# Patient Record
Sex: Female | Born: 1975 | Race: Black or African American | Hispanic: No | Marital: Married | Smoking: Never smoker
Health system: Southern US, Community
[De-identification: ages and names within clinical notes are randomized; demographics above are authoritative.]

## PROBLEM LIST (undated history)

## (undated) HISTORY — PX: TUBAL LIGATION: SHX77

---

## 2010-02-03 ENCOUNTER — Emergency Department: Payer: Self-pay | Admitting: Emergency Medicine

## 2011-12-25 IMAGING — CR DG CHEST 2V
1 series · 2 of 2 positions shown · non-contrast
Comparison: none

REASON FOR EXAM: cough, fever, chest pain
COMMENTS:

PROCEDURE:     DXR - DXR CHEST PA (OR AP) AND LATERAL  - February 03, 2010 [DATE]
RESULT:     The lungs are mildly hypoinflated. There is dense consolidation
in the right upper lobe medially. The heart is not enlarged and the
pulmonary vascularity is not engorged.

[Series 1: view not recorded · 0.17mm/px · 2 of 2 slices shown]
[im 1/2]
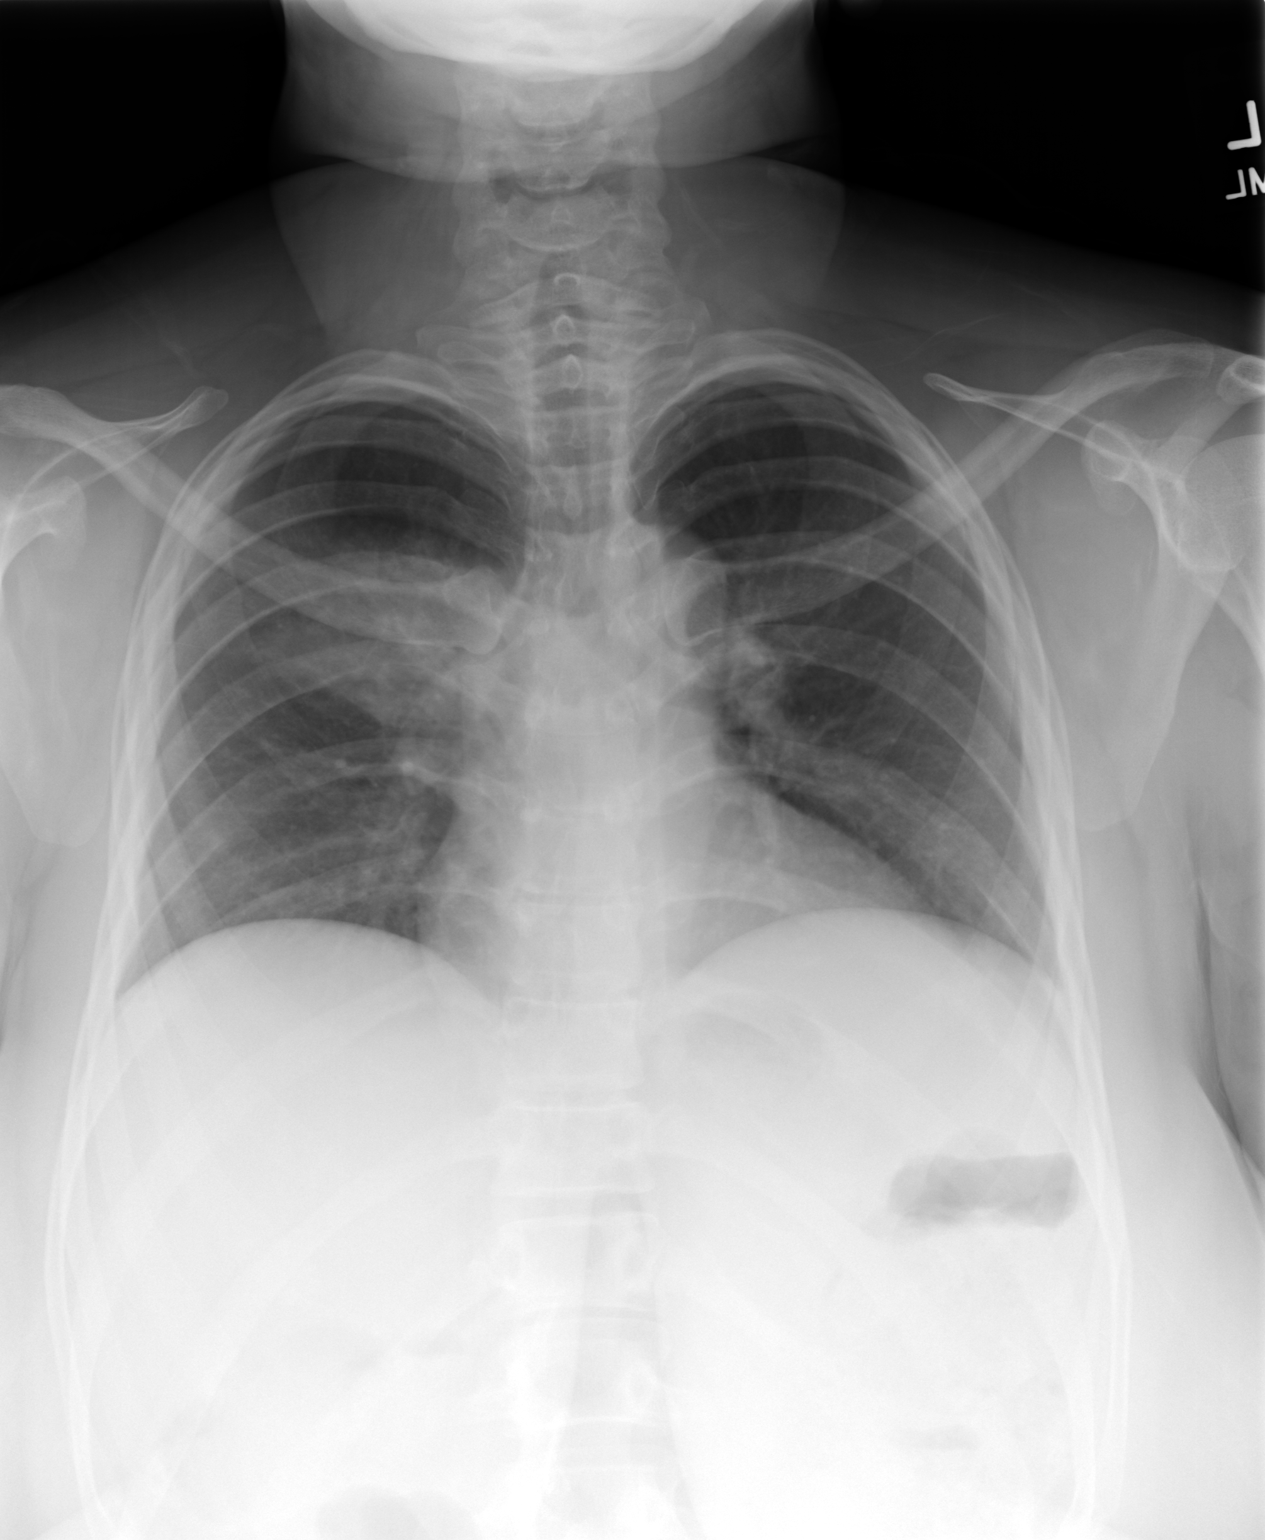
[im 2/2]
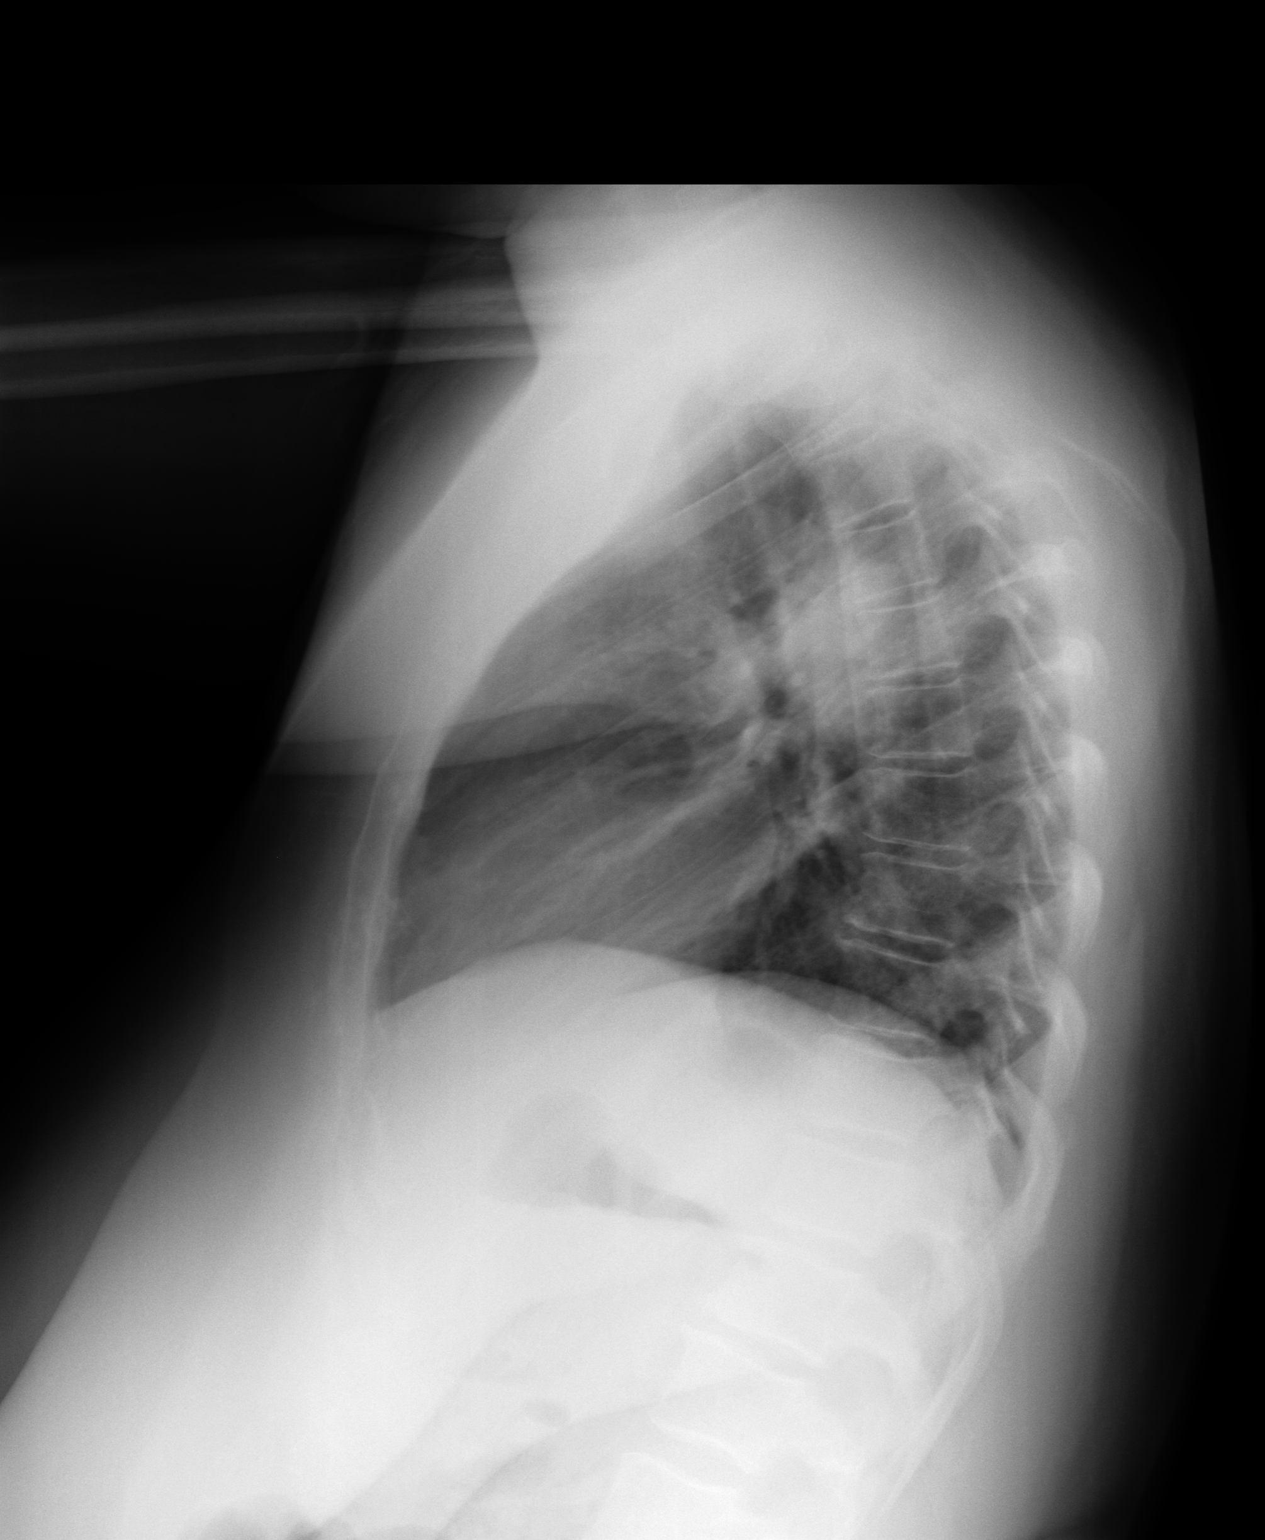

[2 of 2 positions shown; findings below may reference images not displayed]

IMPRESSION: The findings are consistent with right upper lobe pneumonia.

## 2013-10-04 ENCOUNTER — Emergency Department: Payer: Self-pay | Admitting: Emergency Medicine

## 2013-10-07 LAB — BETA STREP CULTURE(ARMC)

## 2014-09-15 ENCOUNTER — Ambulatory Visit: Payer: Self-pay | Admitting: Internal Medicine

## 2017-01-07 ENCOUNTER — Ambulatory Visit: Payer: Self-pay | Admitting: Nurse Practitioner

## 2017-04-10 ENCOUNTER — Other Ambulatory Visit: Payer: Self-pay

## 2017-04-10 ENCOUNTER — Ambulatory Visit: Payer: BC Managed Care – PPO | Admitting: Nurse Practitioner

## 2017-04-10 ENCOUNTER — Encounter: Payer: Self-pay | Admitting: Nurse Practitioner

## 2017-04-10 VITALS — BP 131/87 | HR 73 | Temp 98.6°F | Ht 67.75 in | Wt 216.0 lb

## 2017-04-10 DIAGNOSIS — R51 Headache: Secondary | ICD-10-CM | POA: Diagnosis not present

## 2017-04-10 DIAGNOSIS — Z7689 Persons encountering health services in other specified circumstances: Secondary | ICD-10-CM | POA: Diagnosis not present

## 2017-04-10 DIAGNOSIS — T3995XA Adverse effect of unspecified nonopioid analgesic, antipyretic and antirheumatic, initial encounter: Principal | ICD-10-CM

## 2017-04-10 DIAGNOSIS — R519 Headache, unspecified: Secondary | ICD-10-CM

## 2017-04-10 DIAGNOSIS — G4440 Drug-induced headache, not elsewhere classified, not intractable: Secondary | ICD-10-CM

## 2017-04-10 NOTE — Patient Instructions (Addendum)
Michelle Branch, Thank you for coming in to clinic today.  1. Significantly reduce your use of BC powder.  Excedrine migraine or ibuprofen are good options for future headaches.   - Only take when you have an active headache. - Can take 600-800 mg ibuprofen once every 8 hours up to max of 3 per day.   2. You can reduce your headaches overall if you stop taking medication and put up with the headache for several days and break the cycle of "rebound headaches" from use of NSAIDS.  Please schedule a follow-up appointment with Wilhelmina McardleLauren Jayceion Lisenby, AGNP. Return in about 2 months (around 06/08/2017) for annual physical.  If you have any other questions or concerns, please feel free to call the clinic or send a message through MyChart. You may also schedule an earlier appointment if necessary.  You will receive a survey after today's visit either digitally by e-mail or paper by [employer] mail. Your experiences and feedback matter to us.  Please respond so we know how we are doing as we provide care for you.   Wilhelmina McardleLauren Karlynn Furrow, DNP, AGNP-BC Adult Gerontology Nurse Practitioner [facility], [facility]

## 2017-04-10 NOTE — Progress Notes (Signed)
Subjective:    Patient ID: Michelle Branch, female    DOB: 12-31-1975, 42 y.o.   MRN: [id number]  Michelle Branch is a 42 y.o. female presenting on 04/10/2017 for Establish Care (transition care from Dr. Esmond HarpsKim Jones )   HPI Establish Care New Provider Pt last seen by PCP Dr. Esmond HarpsKim Jones in early 2017.  Obtain records.   URI symptoms: Patient has had recent URI symptoms within the last week.  Patient does report her cold is improving.  Breast cancer screening Patient is now 42 years old and desires to discuss mammogram screening she has never had one in the past and is not sure when she should start.  Headaches Patient reports regular headaches.  She has migraines frequently.  She also uses BC powders several times a day.  She takes up to 6 doses a day and takes it sometimes even before onset of headache to prevent headaches.  She is been encouraged by family members that she takes too much BC powder. -Occasionally Excedrin Migraine or ibuprofen will help, but she notes "BC powders always work best." - Patient has had no signs or symptoms of melena or other GI bleed.  Denies abdominal pain.   History reviewed. No pertinent past medical history. Past Surgical History:  Procedure Laterality Date  . TUBAL LIGATION     Social History   Socioeconomic History  . Marital status: Married    Spouse name: Not on file  . Number of children: 2  . Years of education: associates  . Highest education level: Associate degree: occupational, Scientist, product/process developmenttechnical, or vocational program  Occupational History  . Not on file  Social Needs  . Financial resource strain: Not on file  . Food insecurity:    Worry: Not on file    Inability: Not on file  . Transportation needs:    Medical: Not on file    Non-medical: Not on file  Tobacco Use  . Smoking status: Never Smoker  . Smokeless tobacco: Never Used  Substance and Sexual Activity  . Alcohol use: Yes    Comment: ocassionally  . Drug use: No  . Sexual  activity: Yes    Birth control/protection: Surgical  Lifestyle  . Physical activity:    Days per week: Not on file    Minutes per session: Not on file  . Stress: Not on file  Relationships  . Social connections:    Talks on phone: Not on file    Gets together: Not on file    Attends religious service: Not on file    Active member of club or organization: Not on file    Attends meetings of clubs or organizations: Not on file    Relationship status: Not on file  . Intimate partner violence:    Fear of current or ex partner: No    Emotionally abused: No    Physically abused: No    Forced sexual activity: No  Other Topics Concern  . Not on file  Social History Narrative  . Not on file   Family History  Problem Relation Age of Onset  . Diabetes Paternal Grandmother    Current Outpatient Medications on File Prior to Visit  Medication Sig  . Multiple Vitamins-Calcium (ONE-A-DAY WOMENS FORMULA PO) Take by mouth.   No current facility-administered medications on file prior to visit.     Review of Systems  Constitutional: Negative.   HENT: Negative.   Eyes: Negative.   Respiratory: Negative.   Cardiovascular: Negative.  Gastrointestinal: Negative.   Endocrine: Negative.   Genitourinary: Negative.   Musculoskeletal: Negative.   Skin: Negative.   Allergic/Immunologic: Negative.   Neurological: Positive for headaches.  Hematological: Negative.   Psychiatric/Behavioral: Negative.    Per HPI unless specifically indicated above     Objective:    BP 131/87 (BP Location: Right Arm, Patient Position: Sitting, Cuff Size: Normal)   Pulse 73   Temp 98.6 F (37 C) (Oral)   Ht 5' 7.75" (1.721 m)   Wt 216 lb (98 kg)   LMP 04/02/2017 (Approximate)   BMI 33.09 kg/m   Wt Readings from Last 3 Encounters:  04/10/17 216 lb (98 kg)    Physical Exam  General - obese, well-appearing, NAD HEENT - Normocephalic, atraumatic, PERRL, EOMI, patent nares w/o congestion, oropharynx  clear, MMM Neck - supple, non-tender, no LAD, no thyromegaly, no carotid bruit Heart - RRR, no murmurs heard Lungs - Clear throughout all lobes, no wheezing, crackles, or rhonchi. Normal work of breathing. Abdomen - soft, NTND, no masses, no hepatosplenomegaly, active bowel sounds Extremeties - non-tender, no edema, cap refill < 2 seconds, peripheral pulses intact +2 bilaterally Skin - warm, dry, no rashes Neuro - awake, alert, oriented x3, CN II-X intact, intact muscle strength 5/5 bilaterally, intact distal sensation to light touch, normal coordination, normal gait Psych - Normal mood and affect, normal behavior     No results found for this or any previous visit.    Assessment & Plan:   Problem List Items Addressed This Visit    None    Visit Diagnoses    Encounter to establish care    -  Primary Patient with recent PCP visits with Dr. Esmond Harps.  Desires to transfer care.  Records reviewed in care everywhere.  Patient history reviewed in clinic today.  Will followup in 2 month approx for annual physical and will readdress mammogram screening.  With average risk, pt has elected currently to defer mammography.    Recurrent headache       Analgesic rebound headache      Chronic tension type headache with rebound headaches secondary to excessive use of aspirin for abortive therapy.  Use of up to 6 BC powder per day is reported by patient.  No signs or symptoms of alarm.  No active neurological deficits noted.  Plan: 1.  Encouraged patient to stop using BC powder completely.  May use Excedrin Migraine or ibuprofen 600-800 mg once daily for abortive therapy. 2. Disease education about tension-type headache provided.  Pt verablizes understanding. 3. Followup 2 months.       Follow up plan: Return in about 2 months (around 06/08/2017) for annual physical.  Wilhelmina Mcardle, DNP, AGPCNP-BC Adult Gerontology Primary Care Nurse Practitioner [facility] 05/12/2017, 3:06 PM

## 2017-06-05 ENCOUNTER — Encounter: Payer: Self-pay | Admitting: Nurse Practitioner

## 2017-06-05 ENCOUNTER — Other Ambulatory Visit: Payer: Self-pay

## 2017-06-05 ENCOUNTER — Ambulatory Visit (INDEPENDENT_AMBULATORY_CARE_PROVIDER_SITE_OTHER): Payer: BC Managed Care – PPO | Admitting: Nurse Practitioner

## 2017-06-05 VITALS — BP 139/83 | HR 82 | Temp 98.6°F | Ht 67.75 in | Wt 208.6 lb

## 2017-06-05 DIAGNOSIS — Z0000 Encounter for general adult medical examination without abnormal findings: Secondary | ICD-10-CM | POA: Diagnosis not present

## 2017-06-05 DIAGNOSIS — Z124 Encounter for screening for malignant neoplasm of cervix: Secondary | ICD-10-CM | POA: Diagnosis not present

## 2017-06-05 NOTE — Progress Notes (Signed)
Subjective:    Patient ID: Michelle Branch, female    DOB: 11-Feb-1976, 42 y.o.   MRN: [id number]  Michelle Branch is a 42 y.o. female presenting on 06/05/2017 for Annual Exam   HPI Annual Physical Exam Patient has been feeling well with reduced headaches after stopping BC powder.  They have no acute concerns today. Sleeps 7 hours per night uninterrupted.    HEALTH MAINTENANCE: Weight/BMI: reduced by 8 lbs Physical activity: exercise at gym 30-45 minutes twice per week. Diet: poor with meals out 5-6 x weekly, long periods of daytime fasting (breakfast and late dinner 10 pm) Seatbelt: always Sunscreen: never PAP: due Mammogram: declines to later date HIV: negative in past and no new sexual encounters Optometry: yearly Dentistry: yearly  VACCINES: Tetanus: has had to have for childcare Influenza: not received 2018-2019  No past medical history on file. Past Surgical History:  Procedure Laterality Date  . TUBAL LIGATION     Social History   Socioeconomic History  . Marital status: Married    Spouse name: Not on file  . Number of children: 2  . Years of education: associates  . Highest education level: Associate degree: occupational, Scientist, product/process development, or vocational program  Occupational History  . Not on file  Social Needs  . Financial resource strain: Not on file  . Food insecurity:    Worry: Not on file    Inability: Not on file  . Transportation needs:    Medical: Not on file    Non-medical: Not on file  Tobacco Use  . Smoking status: Never Smoker  . Smokeless tobacco: Never Used  Substance and Sexual Activity  . Alcohol use: Yes    Comment: ocassionally  . Drug use: No  . Sexual activity: Yes    Birth control/protection: Surgical  Lifestyle  . Physical activity:    Days per week: Not on file    Minutes per session: Not on file  . Stress: Not on file  Relationships  . Social connections:    Talks on phone: Not on file    Gets together: Not on file   Attends religious service: Not on file    Active member of club or organization: Not on file    Attends meetings of clubs or organizations: Not on file    Relationship status: Not on file  . Intimate partner violence:    Fear of current or ex partner: No    Emotionally abused: No    Physically abused: No    Forced sexual activity: No  Other Topics Concern  . Not on file  Social History Narrative  . Not on file   Family History  Problem Relation Age of Onset  . Diabetes Paternal Grandmother    Current Outpatient Medications on File Prior to Visit  Medication Sig  . Multiple Vitamins-Calcium (ONE-A-DAY WOMENS FORMULA PO) Take by mouth.   No current facility-administered medications on file prior to visit.     Review of Systems Per HPI unless specifically indicated above     Objective:    BP 139/83 (BP Location: Right Arm, Patient Position: Sitting, Cuff Size: Normal)   Pulse 82   Temp 98.6 F (37 C) (Oral)   Ht 5' 7.75" (1.721 m)   Wt 208 lb 9.6 oz (94.6 kg)   LMP 05/13/2017 (Approximate)   BMI 31.95 kg/m   Wt Readings from Last 3 Encounters:  06/05/17 208 lb 9.6 oz (94.6 kg)  04/10/17 216 lb (98 kg)  Physical Exam  Constitutional: She is oriented to person, place, and time. She appears well-developed and well-nourished. No distress.  HENT:  Head: Normocephalic and atraumatic.  Right Ear: External ear normal.  Left Ear: External ear normal.  Nose: Nose normal.  Mouth/Throat: Oropharynx is clear and moist.  Eyes: Pupils are equal, round, and reactive to light. Conjunctivae and EOM are normal.  Neck: Normal range of motion. Neck supple. No JVD present. No tracheal deviation present. No thyromegaly present.  Cardiovascular: Normal rate, regular rhythm, normal heart sounds and intact distal pulses. Exam reveals no gallop and no friction rub.  No murmur heard. Pulmonary/Chest: Effort normal and breath sounds normal. No respiratory distress. Right breast exhibits no  inverted nipple, no mass, no nipple discharge, no skin change and no tenderness. Left breast exhibits no inverted nipple, no mass, no nipple discharge, no skin change and no tenderness. Breasts are symmetrical.  Abdominal: Soft. Bowel sounds are normal. She exhibits no distension and no mass. There is no tenderness. Hernia confirmed negative in the right inguinal area and confirmed negative in the left inguinal area.  Genitourinary: Vagina normal and uterus normal. Pelvic exam was performed with patient supine. No labial fusion. There is no rash, tenderness, lesion or injury on the right labia. There is no rash, tenderness, lesion or injury on the left labia. Cervix exhibits no motion tenderness, no discharge and no friability. Right adnexum displays no mass, no tenderness and no fullness. Left adnexum displays no mass, no tenderness and no fullness. No vaginal discharge found.    Genitourinary Comments: Rough texture to cervix upon palpation. Multiple small white lesions noted between 12-3 o'clock  Musculoskeletal: Normal range of motion.  Lymphadenopathy:    She has no cervical adenopathy. No inguinal adenopathy noted on the right or left side.  Neurological: She is alert and oriented to person, place, and time. No cranial nerve deficit.  Skin: Skin is warm and dry. Capillary refill takes less than 2 seconds.  Psychiatric: She has a normal mood and affect. Her behavior is normal. Judgment and thought content normal.  Nursing note and vitals reviewed.     Assessment & Plan:   Problem List Items Addressed This Visit    None    Visit Diagnoses    Encounter for annual physical exam    -  Primary   Relevant Orders   TSH   VITAMIN D 25 Hydroxy (Vit-D Deficiency, Fractures)   Lipid panel   Hemoglobin A1c   CBC with Differential/Platelet   COMPLETE METABOLIC PANEL WITH GFR   Pap IG and HPV (high risk) DNA detection   Cervical cancer screening       Relevant Orders   Pap IG and HPV (high  risk) DNA detection      Physical exam with no new findings.  Well adult with no acute concerns.  Plan: 1. Obtain health maintenance screenings. - Discussed timing for mammogram screening.  Pt is at average risk for breast cancer with no family history of breast cancer noted. - Discussed PAP smear intervals.  Pt will prefer to have extended 5 year interval if pap is normal and HPV negative.  PAP performed today for cervical ca screening. - Labs collected today for routine screenings.  2. Return 1 year for annual physical.  Follow up plan: Return in about 1 year (around 06/06/2018) for annual physical.  Wilhelmina McardleLauren Letrice Pollok, DNP, AGPCNP-BC Adult Gerontology Primary Care Nurse Practitioner [facility] 06/05/2017, 9:10 AM

## 2017-06-05 NOTE — Patient Instructions (Addendum)
Rosario JacksJackie Trautmann,   Thank you for coming in to clinic today.  1. Increase your physical activity until you are increasing your heart rate for 30 minutes on most days of the week.  2. Eat regular, healthy meals or snacks.  Avoid eating late at night.  3. Continue using ibuprofen only as needed.  Please schedule a follow-up appointment with Wilhelmina McardleLauren Pinki Rottman, AGNP. Return in about 1 year (around 06/06/2018) for annual physical.  If you have any other questions or concerns, please feel free to call the clinic or send a message through MyChart. You may also schedule an earlier appointment if necessary.  You will receive a survey after today's visit either digitally by e-mail or paper by [employer] mail. Your experiences and feedback matter to us.  Please respond so we know how we are doing as we provide care for you.   Wilhelmina McardleLauren Elisa Sorlie, DNP, AGNP-BC Adult Gerontology Nurse Practitioner [facility], [facility]

## 2017-06-06 LAB — CBC WITH DIFFERENTIAL/PLATELET
Basophils Absolute: 32 cells/uL (ref 0–200)
Basophils Relative: 0.7 %
Eosinophils Absolute: 419 cells/uL (ref 15–500)
Eosinophils Relative: 9.1 %
HCT: 35.9 % (ref 35.0–45.0)
Hemoglobin: 11.7 g/dL (ref 11.7–15.5)
Lymphs Abs: 1789 cells/uL (ref 850–3900)
MCH: 26.4 pg — ABNORMAL LOW (ref 27.0–33.0)
MCHC: 32.6 g/dL (ref 32.0–36.0)
MCV: 80.9 fL (ref 80.0–100.0)
MPV: 10.2 fL (ref 7.5–12.5)
Monocytes Relative: 11.3 %
Neutro Abs: 1840 cells/uL (ref 1500–7800)
Neutrophils Relative %: 40 %
Platelets: 253 10*3/uL (ref 140–400)
RBC: 4.44 10*6/uL (ref 3.80–5.10)
RDW: 12 % (ref 11.0–15.0)
Total Lymphocyte: 38.9 %
WBC mixed population: 520 cells/uL (ref 200–950)
WBC: 4.6 10*3/uL (ref 3.8–10.8)

## 2017-06-06 LAB — COMPLETE METABOLIC PANEL WITH GFR
AG Ratio: 1.4 (calc) (ref 1.0–2.5)
ALT: 10 U/L (ref 6–29)
AST: 11 U/L (ref 10–30)
Albumin: 4 g/dL (ref 3.6–5.1)
Alkaline phosphatase (APISO): 139 U/L — ABNORMAL HIGH (ref 33–115)
BUN/Creatinine Ratio: 23 (calc) — ABNORMAL HIGH (ref 6–22)
BUN: 11 mg/dL (ref 7–25)
CO2: 28 mmol/L (ref 20–32)
Calcium: 9.5 mg/dL (ref 8.6–10.2)
Chloride: 103 mmol/L (ref 98–110)
Creat: 0.47 mg/dL — ABNORMAL LOW (ref 0.50–1.10)
GFR, Est African American: 143 mL/min/{1.73_m2} (ref >=60)
GFR, Est Non African American: 124 mL/min/{1.73_m2} (ref >=60)
Globulin: 2.9 g/dL (calc) (ref 1.9–3.7)
Glucose, Bld: 85 mg/dL (ref 65–99)
Potassium: 4.3 mmol/L (ref 3.5–5.3)
Sodium: 136 mmol/L (ref 135–146)
Total Bilirubin: 0.3 mg/dL (ref 0.2–1.2)
Total Protein: 6.9 g/dL (ref 6.1–8.1)

## 2017-06-06 LAB — HEMOGLOBIN A1C
Hgb A1c MFr Bld: 5.2 % of total Hgb (ref <5.7)
Mean Plasma Glucose: 103 (calc)
eAG (mmol/L): 5.7 (calc)

## 2017-06-06 LAB — TSH: TSH: <0.01 mIU/L — ABNORMAL LOW

## 2017-06-06 LAB — LIPID PANEL
Cholesterol: 232 mg/dL — ABNORMAL HIGH (ref <200)
HDL: 70 mg/dL (ref >50)
LDL Cholesterol (Calc): 151 mg/dL (calc) — ABNORMAL HIGH
Non-HDL Cholesterol (Calc): 162 mg/dL (calc) — ABNORMAL HIGH (ref <130)
Total CHOL/HDL Ratio: 3.3 (calc) (ref <5.0)
Triglycerides: 37 mg/dL (ref <150)

## 2017-06-06 LAB — VITAMIN D 25 HYDROXY (VIT D DEFICIENCY, FRACTURES): Vit D, 25-Hydroxy: 15 ng/mL — ABNORMAL LOW (ref 30–100)

## 2017-06-08 ENCOUNTER — Other Ambulatory Visit: Payer: Self-pay | Admitting: Nurse Practitioner

## 2017-06-08 ENCOUNTER — Encounter: Payer: Self-pay | Admitting: Nurse Practitioner

## 2017-06-08 DIAGNOSIS — E559 Vitamin D deficiency, unspecified: Secondary | ICD-10-CM

## 2017-06-08 DIAGNOSIS — R748 Abnormal levels of other serum enzymes: Secondary | ICD-10-CM

## 2017-06-08 DIAGNOSIS — E0590 Thyrotoxicosis, unspecified without thyrotoxic crisis or storm: Secondary | ICD-10-CM

## 2017-06-08 DIAGNOSIS — R87610 Atypical squamous cells of undetermined significance on cytologic smear of cervix (ASC-US): Secondary | ICD-10-CM

## 2017-06-08 LAB — PAP IG AND HPV HIGH-RISK: HPV DNA High Risk: NOT DETECTED

## 2017-06-08 MED ORDER — VITAMIN D (ERGOCALCIFEROL) 1.25 MG (50000 UNIT) PO CAPS: 50000.0000 [IU] | ORAL_CAPSULE | ORAL | 0 refills | Status: AC

## 2017-06-08 NOTE — Progress Notes (Signed)
I have called this patient about her next appointment. She has put it on her calendar to call back when she knows her schedule.

## 2017-07-09 ENCOUNTER — Encounter: Payer: Self-pay | Admitting: Nurse Practitioner

## 2017-07-09 DIAGNOSIS — E559 Vitamin D deficiency, unspecified: Secondary | ICD-10-CM | POA: Insufficient documentation

## 2017-07-20 ENCOUNTER — Encounter: Payer: Self-pay | Admitting: Obstetrics and Gynecology

## 2017-07-20 ENCOUNTER — Ambulatory Visit (INDEPENDENT_AMBULATORY_CARE_PROVIDER_SITE_OTHER): Payer: BC Managed Care – PPO | Admitting: Obstetrics and Gynecology

## 2017-07-20 VITALS — BP 137/84 | HR 82 | Ht 68.0 in | Wt 204.8 lb

## 2017-07-20 DIAGNOSIS — R87610 Atypical squamous cells of undetermined significance on cytologic smear of cervix (ASC-US): Secondary | ICD-10-CM | POA: Diagnosis not present

## 2017-07-20 DIAGNOSIS — N888 Other specified noninflammatory disorders of cervix uteri: Secondary | ICD-10-CM | POA: Diagnosis not present

## 2017-07-20 DIAGNOSIS — N898 Other specified noninflammatory disorders of vagina: Secondary | ICD-10-CM

## 2017-07-20 NOTE — Progress Notes (Signed)
Pt is present today due to having ASCUS of cervix with neg high risk HPV. Pt stated that she is not having any problems, but had a pap smear recently and her doctor found white spots on her cervix.

## 2017-07-20 NOTE — Progress Notes (Signed)
GYNECOLOGY PROGRESS NOTE  Subjective:    Patient ID: Michelle Branch, female    DOB: 1975-11-30, 42 y.o.   MRN: [id number]  HPI  Patient is a 42 y.o. [id number] female who was referred from her PCP Wilhelmina Mcardle(Lauren Kennedy, DNP) from [facility] who presents for abnormal pap smear and possible white lesions on cervix.  Patient with recent ASCUS, HR HPV neg pap on 05/2017.  Patient denies h/o abnormal pap smears.  Denies any complaints of vaginal discharge, abnormal menses, or pelvic pain.  Up to date on routine screening (mammogram performed in April).     History reviewed. No pertinent past medical history.    OB History  Gravida Para Term Preterm AB Living  2 2 2     2   SAB TAB Ectopic Multiple Live Births          2    # Outcome Date GA Lbr Len/2nd Weight Sex Delivery Anes PTL Lv  2 Term 2000    F Vag-Spont   LIV  1 Term 1998    F Vag-Spont   LIV    Family History  Problem Relation Age of Onset  . Diabetes Paternal Grandmother     Past Surgical History:  Procedure Laterality Date  . TUBAL LIGATION      Social History   Socioeconomic History  . Marital status: Married    Spouse name: Not on file  . Number of children: 2  . Years of education: associates  . Highest education level: Associate degree: occupational, Scientist, product/process developmenttechnical, or vocational program  Occupational History  . Not on file  Social Needs  . Financial resource strain: Not on file  . Food insecurity:    Worry: Not on file    Inability: Not on file  . Transportation needs:    Medical: Not on file    Non-medical: Not on file  Tobacco Use  . Smoking status: Never Smoker  . Smokeless tobacco: Never Used  Substance and Sexual Activity  . Alcohol use: Not Currently    Comment: ocassionally  . Drug use: No  . Sexual activity: Yes    Birth control/protection: Surgical    Comment: tibual  Lifestyle  . Physical activity:    Days per week: Not on file    Minutes per session: Not on file  .  Stress: Not on file  Relationships  . Social connections:    Talks on phone: Not on file    Gets together: Not on file    Attends religious service: Not on file    Active member of club or organization: Not on file    Attends meetings of clubs or organizations: Not on file    Relationship status: Not on file  . Intimate partner violence:    Fear of current or ex partner: No    Emotionally abused: No    Physically abused: No    Forced sexual activity: No  Other Topics Concern  . Not on file  Social History Narrative  . Not on file    Current Outpatient Medications on File Prior to Visit  Medication Sig Dispense Refill  . Multiple Vitamins-Calcium (ONE-A-DAY WOMENS FORMULA PO) Take by mouth.    . Vitamin D, Ergocalciferol, (DRISDOL) 50000 units CAPS capsule Take 1 capsule (50,000 Units total) by mouth every 7 (seven) days for 8 doses. 8 capsule 0   No current facility-administered medications on file prior to visit.     No Known Allergies  Review of Systems A comprehensive review of systems was negative except for: Genitourinary: positive for vaginal dryness since initiation of Vitamin D therapy.    Objective:   Blood pressure 137/84, pulse 82, height 5\' 8"  (1.727 m), weight 204 lb 12.8 oz (92.9 kg), last menstrual period 07/19/2017. General appearance: alert and no distress.  Pelvic: external genitalia normal, rectovaginal septum normal.  Vagina with small amount of dark red blood.  Blood removed to visualize cervix. Cervix normal appearing, several small nabothian cysts present near 3-4 o'clock region, and no motion tenderness.  Uterus mobile, nontender, normal shape and size.  Adnexae non-palpable, nontender bilaterally.   Assessment:   ASCUS of cervix with negative high risk HPV Nabothian cyst Vaginal dryness   Plan:   1. ASCUS of cervix with negative high risk HPV -discussed Pap smear results with patient.  Advised that her Pap smear is negative, she could  continue routine cervical cancer screening as she was at low risk for cervical cancer. Can continue q 3-5 year pap smears.  2. Nabothian cyst -patient with findings of nabothian cysts, which involve the the cervical mucus glands. Reassured patient that these are not worrisome, are benign findings, and will usually self resolve.  3. Vaginal dryness -patient reports Vaginal dryness since initiation of her vitamin D supplements.  Discussed use of vaginal lubricants until her treatment is completed.  Patient was given samples UberLube vaginal lubrican today.  Advised on other over-the-counter options.  To follow up as needed.    Hildred Laser, MD [facility]

## 2017-07-31 ENCOUNTER — Ambulatory Visit: Payer: BC Managed Care – PPO | Admitting: Nurse Practitioner

## 2018-03-12 ENCOUNTER — Ambulatory Visit (INDEPENDENT_AMBULATORY_CARE_PROVIDER_SITE_OTHER): Payer: BC Managed Care – PPO | Admitting: Nurse Practitioner

## 2018-03-12 ENCOUNTER — Encounter: Payer: Self-pay | Admitting: Nurse Practitioner

## 2018-03-12 VITALS — BP 121/70 | HR 75 | Temp 98.4°F | Ht 68.0 in | Wt 215.2 lb

## 2018-03-12 DIAGNOSIS — L509 Urticaria, unspecified: Secondary | ICD-10-CM | POA: Diagnosis not present

## 2018-03-12 DIAGNOSIS — L239 Allergic contact dermatitis, unspecified cause: Secondary | ICD-10-CM | POA: Diagnosis not present

## 2018-03-12 NOTE — Patient Instructions (Addendum)
Michelle Branch,   Thank you for coming in to clinic today.  1. For future allergic hives reactions: - May take benadryl 2 tabs every 12 hours only in 24 hours MAX.   - May also take one Zantac or Pepcid  2. To prevent reactions: - Can start Claritin, Allegra, or Zyrtec (cetirizine) once daily for the next 2-4 weeks. Generics are okay.  3. Avoid cause of hives if you can identify it.   - Try moisturizers by Eucerin or CeraVe - Dove is a good soap  Please schedule a follow-up appointment with Wilhelmina Mcardle, AGNP. Return if symptoms worsen or fail to improve.  If you have any other questions or concerns, please feel free to call the clinic or send a message through MyChart. You may also schedule an earlier appointment if necessary.  You will receive a survey after today's visit either digitally by e-mail or paper by [employer]. Your experiences and feedback matter to [place].  Please respond so we know how we are doing as we provide care for you.   Wilhelmina Mcardle, DNP, AGNP-BC Adult Gerontology Nurse Practitioner [facility], [facility]

## 2018-03-12 NOTE — Progress Notes (Signed)
Subjective:    Patient ID: Michelle Branch, female    DOB: 14-Jun-1975, 43 y.o.   MRN: [id number]  Michelle Branch is a 43 y.o. female presenting on 03/12/2018 for Rash (intermittent rash/hives that started on the back, face, and arms.  Pt states yesterday she went to the gym and the rash appeared again all over. She took benadryl and the rash went away.  )   HPI Rash Intermittent rash since November, with dark colored rash after hair dye.  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   But this rash today is different.  Small scar on face. - improved today with benadryl and no rash currently present - Felt heat, hives with raised rash.   - Recent rash started at [facility] with new machine cleaner spray.  Social History   Tobacco Use  . Smoking status: Never Smoker  . Smokeless tobacco: Never Used  Substance Use Topics  . Alcohol use: Not Currently    Comment: ocassionally  . Drug use: No    Review of Systems Per HPI unless specifically indicated above     Objective:    BP 121/70 (BP Location: Left Arm, Patient Position: Sitting, Cuff Size: Large)   Pulse 75   Temp 98.4 F (36.9 C) (Oral)   Ht 5\' 8"  (1.727 m)   Wt 215 lb 3.2 oz (97.6 kg)   BMI 32.72 kg/m   Wt Readings from Last 3 Encounters:  03/12/18 215 lb 3.2 oz (97.6 kg)  07/20/17 204 lb 12.8 oz (92.9 kg)  06/05/17 208 lb 9.6 oz (94.6 kg)    Physical Exam Vitals signs reviewed.  Constitutional:      General: She is not in acute distress.    Appearance: She is well-developed.  HENT:     Head: Normocephalic and atraumatic.  Cardiovascular:     Rate and Rhythm: Normal rate and regular rhythm.     Pulses:          Radial pulses are 2+ on the right side and 2+ on the left side.       Posterior tibial pulses are 1+ on the right side and 1+ on the left side.     Heart sounds: Normal heart sounds, S1 normal and S2 normal.  Pulmonary:     Effort: Pulmonary effort is normal. No  respiratory distress.     Breath sounds: Normal breath sounds and air entry.  Musculoskeletal:     Right lower leg: No edema.     Left lower leg: No edema.  Skin:    General: Skin is warm and dry.     Capillary Refill: Capillary refill takes less than 2 seconds.     Findings: No rash.  Neurological:     Mental Status: She is alert and oriented to person, place, and time.  Psychiatric:        Attention and Perception: Attention normal.        Mood and Affect: Mood and affect normal.        Behavior: Behavior normal. Behavior is cooperative.        Thought Content: Thought content normal.        Judgment: Judgment normal.     Results for orders placed or performed in visit on 06/05/17  TSH  Result Value Ref Range   TSH <0.01 (L) mIU/L  VITAMIN D 25 Hydroxy (Vit-D Deficiency, Fractures)  Result Value Ref Range   Vit D, 25-Hydroxy 15 (  L) 30 - 100 ng/mL  Lipid panel  Result Value Ref Range   Cholesterol 232 (H) <200 mg/dL   HDL 70 >29 mg/dL   Triglycerides 37 <528 mg/dL   LDL Cholesterol (Calc) 151 (H) mg/dL (calc)   Total CHOL/HDL Ratio 3.3 <5.0 (calc)   Non-HDL Cholesterol (Calc) 162 (H) <130 mg/dL (calc)  Hemoglobin U1L  Result Value Ref Range   Hgb A1c MFr Bld 5.2 <5.7 % of total Hgb   Mean Plasma Glucose 103 (calc)   eAG (mmol/L) 5.7 (calc)  CBC with Differential/Platelet  Result Value Ref Range   WBC 4.6 3.8 - 10.8 Thousand/uL   RBC 4.44 3.80 - 5.10 Million/uL   Hemoglobin 11.7 11.7 - 15.5 g/dL   HCT 24.4 01.0 - 27.2 %   MCV 80.9 80.0 - 100.0 fL   MCH 26.4 (L) 27.0 - 33.0 pg   MCHC 32.6 32.0 - 36.0 g/dL   RDW 53.6 64.4 - 03.4 %   Platelets 253 140 - 400 Thousand/uL   MPV 10.2 7.5 - 12.5 fL   Neutro Abs 1,840 1,500 - 7,800 cells/uL   Lymphs Abs 1,789 850 - 3,900 cells/uL   WBC mixed population 520 200 - 950 cells/uL   Eosinophils Absolute 419 15 - 500 cells/uL   Basophils Absolute 32 0 - 200 cells/uL   Neutrophils Relative % 40 %   Total Lymphocyte 38.9 %    Monocytes Relative 11.3 %   Eosinophils Relative 9.1 %   Basophils Relative 0.7 %  COMPLETE METABOLIC PANEL WITH GFR  Result Value Ref Range   Glucose, Bld 85 65 - 99 mg/dL   BUN 11 7 - 25 mg/dL   Creat 7.42 (L) 5.95 - 1.10 mg/dL   GFR, Est Non African American 124 > OR = 60 mL/min/1.11m2   GFR, Est African American 143 > OR = 60 mL/min/1.56m2   BUN/Creatinine Ratio 23 (H) 6 - 22 (calc)   Sodium 136 135 - 146 mmol/L   Potassium 4.3 3.5 - 5.3 mmol/L   Chloride 103 98 - 110 mmol/L   CO2 28 20 - 32 mmol/L   Calcium 9.5 8.6 - 10.2 mg/dL   Total Protein 6.9 6.1 - 8.1 g/dL   Albumin 4.0 3.6 - 5.1 g/dL   Globulin 2.9 1.9 - 3.7 g/dL (calc)   AG Ratio 1.4 1.0 - 2.5 (calc)   Total Bilirubin 0.3 0.2 - 1.2 mg/dL   Alkaline phosphatase (APISO) 139 (H) 33 - 115 U/L   AST 11 10 - 30 U/L   ALT 10 6 - 29 U/L  Pap IG and HPV (high risk) DNA detection  Result Value Ref Range   Clinical Information:     LMP:     PREV. PAP:     PREV. BX:     HPV DNA Probe-Source     STATEMENT OF ADEQUACY:     GENERAL CATEGORIZATION: (A)    INTERPRETATION/RESULT: (A)    Comment:     CYTOTECHNOLOGIST:     PATHOLOGIST:     HPV DNA High Risk Not Detected Not Detect      Assessment & Plan:   Problem List Items Addressed This Visit    None    Visit Diagnoses    Hives    -  Primary   Allergic contact dermatitis, unspecified trigger        Consistent with chronic contact dermatitis, unknown triggers. - Today no evidence of rash - Has not recently used loratadine, cetirizine, flonase  Plan: 1. To treat hives, take benadryl 2 tabs every 12 hours prn - May also take one tab Zantac or Pepcid OTC if needed.   2. START antihistamine cetirizine 10 mg once daily for future reaction prevention 3. Use unscented skin care products, detergents; Dove soap, Eucerin/CeraVe lotions for dry skin may be tolerated. 4. Follow-up in future, as needed, consider 2nd opinion from derm/allergy.    Follow up plan: Return if  symptoms worsen or fail to improve.  Wilhelmina McardleLauren Kamani Lewter, DNP, AGPCNP-BC Adult Gerontology Primary Care Nurse Practitioner [facility] 03/12/2018, 3:57 PM

## 2019-09-19 ENCOUNTER — Ambulatory Visit: Payer: Self-pay | Attending: Internal Medicine

## 2019-09-19 DIAGNOSIS — Z23 Encounter for immunization: Secondary | ICD-10-CM

## 2019-09-19 NOTE — Progress Notes (Signed)
   Covid-19 Vaccination Clinic  Name:  Michelle Branch    MRN: [id number] DOB: Mar 28, 1975  09/19/2019  [name] was observed post Covid-19 immunization for 15 minutes without incident. She was provided with Vaccine Information Sheet and instruction to access the V-Safe system.   [name] was instructed to call 911 with any severe reactions post vaccine: Marland Kitchen Difficulty breathing  . Swelling of face and throat  . A fast heartbeat  . A bad rash all over body  . Dizziness and weakness   Immunizations Administered    Name Date Dose VIS Date Route   Pfizer COVID-19 Vaccine 09/19/2019 11:15 AM 0.3 mL 04/13/2018 Intramuscular   Manufacturer: [employer], [employer]   Lot: [id number]   NDC: [address]-1

## 2019-10-10 ENCOUNTER — Ambulatory Visit: Payer: Self-pay | Attending: Internal Medicine

## 2019-10-10 DIAGNOSIS — Z23 Encounter for immunization: Secondary | ICD-10-CM

## 2019-10-10 NOTE — Progress Notes (Signed)
   Covid-19 Vaccination Clinic  Name:  Michelle Branch    MRN: [id number] DOB: November 25, 1975  10/10/2019  Michelle Branch was observed post Covid-19 immunization for 15 minutes without incident. She was provided with Vaccine Information Sheet and instruction to access the V-Safe system.   Michelle Branch was instructed to call 911 with any severe reactions post vaccine: Marland Kitchen Difficulty breathing  . Swelling of face and throat  . A fast heartbeat  . A bad rash all over body  . Dizziness and weakness   Immunizations Administered    Name Date Dose VIS Date Route   Pfizer COVID-19 Vaccine 10/10/2019  3:46 PM 0.3 mL 04/13/2018 Intramuscular   Manufacturer: [employer], [employer]   Lot: [id number]   NDC: [address]-1

## 2019-12-13 ENCOUNTER — Other Ambulatory Visit: Payer: Self-pay

## 2019-12-13 ENCOUNTER — Encounter: Payer: Self-pay | Admitting: Family Medicine

## 2019-12-13 ENCOUNTER — Ambulatory Visit (INDEPENDENT_AMBULATORY_CARE_PROVIDER_SITE_OTHER): Payer: BC Managed Care – PPO | Admitting: Family Medicine

## 2019-12-13 VITALS — BP 134/70 | HR 80 | Temp 98.9°F | Resp 17 | Ht 68.0 in | Wt 227.8 lb

## 2019-12-13 DIAGNOSIS — Z0000 Encounter for general adult medical examination without abnormal findings: Secondary | ICD-10-CM | POA: Insufficient documentation

## 2019-12-13 DIAGNOSIS — R635 Abnormal weight gain: Secondary | ICD-10-CM

## 2019-12-13 DIAGNOSIS — Z79899 Other long term (current) drug therapy: Secondary | ICD-10-CM | POA: Diagnosis not present

## 2019-12-13 DIAGNOSIS — E559 Vitamin D deficiency, unspecified: Secondary | ICD-10-CM

## 2019-12-13 DIAGNOSIS — Z1231 Encounter for screening mammogram for malignant neoplasm of breast: Secondary | ICD-10-CM

## 2019-12-13 NOTE — Patient Instructions (Signed)
As we discussed, have your labs drawn in the next 1-2 weeks and we will contact you with the results.  Well Visit: Care Instructions Overview  Well visits can help you stay healthy. Your provider has checked your overall health and may have suggested ways to take good care of yourself. Your provider also may have recommended tests. At home, you can help prevent illness with healthy eating, regular exercise, and other steps.  Follow-up care is a key part of your treatment and safety. Be sure to make and go to all appointments, and call your provider if you are having problems. It's also a good idea to know your test results and keep a list of the medicines you take.  How can you care for yourself at home?   Get screening tests that you and your doctor decide on. Screening helps find diseases before any symptoms appear.   Eat healthy foods. Choose fruits, vegetables, whole grains, protein, and low-fat dairy foods. Limit fat, especially saturated fat. Reduce salt in your diet.   Limit alcohol. If you are a man, have no more than 2 drinks a day or 14 drinks a week. If you are a woman, have no more than 1 drink a day or 7 drinks a week.   Get at least 30 minutes of physical activity on most days of the week.  We recommend you go no more than 2 days in a row without exercise. Walking is a good choice. You also may want to do other activities, such as running, swimming, cycling, or playing tennis or team sports. Discuss any changes in your exercise program with your provider.   Reach and stay at a healthy weight. This will lower your risk for many problems, such as obesity, diabetes, heart disease, and high blood pressure.   Do not smoke or allow others to smoke around you. If you need help quitting, talk to your provider about stop-smoking programs and medicines. These can increase your chances of quitting for good.  Can call 1-800-QUIT-NOW (403) 005-2605) for the [facility],  assistance with smoking cessation.   Care for your mental health. It is easy to get weighed down by worry and stress. Learn strategies to manage stress, like deep breathing and mindfulness, and stay connected with your family and community. If you find you often feel sad or hopeless, talk with your provider. Treatment can help.   Talk to your provider about whether you have any risk factors for sexually transmitted infections (STIs). You can help prevent STIs if you wait to have sex with a new partner (or partners) until you've each been tested for STIs. It also helps if you use condoms (female or female condoms) and if you limit your sex partners to one person who only has sex with you. Vaccines are available for some STIs, such as HPV (these are age dependent).   If you think you may have a problem with alcohol or drug use, talk to your provider. This includes prescription medicines (such as amphetamines and opioids) and illegal drugs (such as cocaine and methamphetamine). Your provider can help you figure out what type of treatment is best for you.   If you have concerns about domestic violence or intimate partner violence, there are resources available to you. National Domestic Abuse Hotline (613)417-6347   Protect your skin from too much sun. When you're outdoors from 10 a.m. to 4 p.m., stay in the shade or cover up with clothing and a hat with a  wide brim. Wear sunglasses that block UV rays. Even when it's cloudy, put broad-spectrum sunscreen (SPF 30 or higher) on any exposed skin.   See a dentist one or two times a year for checkups and to have your teeth cleaned.   See an eye doctor once per year for an eye exam.   Wear a seat belt in the car.  When should you call for help?  Watch closely for changes in your health, and be sure to contact your provider if you have any problems or symptoms that concern you.  We will plan to see you back in 6 months for well woman exam and PAP  testing  You will receive a survey after today's visit either digitally by e-mail or paper by USPS mail. Your experiences and feedback matter to [place].  Please respond so we know how we are doing as we provide care for you.  Call us with any questions/concerns/needs.  It is my goal to be available to you for your health concerns.  Thanks for choosing me to be a partner in your healthcare needs!  Charlaine Dalton, FNP-C Family Nurse Practitioner [facility] Phone: [phone]

## 2019-12-13 NOTE — Assessment & Plan Note (Signed)
Pt last mammogram unknown.  Interested in starting mammograms due to friend with recent dx breast CA.  Plan: 1. Screening mammogram order placed.  Pt will call to schedule appointment.  Information given.

## 2019-12-13 NOTE — Assessment & Plan Note (Signed)
Status unknown.  Recheck labs.  Followup after labs.  

## 2019-12-13 NOTE — Progress Notes (Signed)
Subjective:    Patient ID: Michelle Branch, female    DOB: [birth year], 44 y.o.   MRN: [id number]  Michelle Branch is a 44 y.o. female presenting on 12/13/2019 for Annual Exam (pt would like to discuss possibly getting a mammogram. No concerns or family history. Pt concern because a recent friend around the same age was diagnose with breast cancer.)   HPI  HEALTH MAINTENANCE:  Weight/BMI: Obese, BMI 34.64% Physical activity: Sedentary Diet: Regularly Seatbelt: Yes Sunscreen: As needed PAP: Completed 06/05/2017, met with OB/GYN and recommended repeat PAP testing in 3 years, due in 6 months Mammogram: Discussed, ordered HIV & Hep C Screening: Offered and declined  GC/CT: Offered and declined  Optometry: Yearly  Dentistry: Regularly  IMMUNIZATIONS: Tetanus: Discussed Influenza: Discussed COVID: Up to date, [place] 09/19/2019, 10/10/2019  Depression screen [facility] 2/9 12/13/2019 06/05/2017 04/10/2017  Decreased Interest 0 0 0  Down, Depressed, Hopeless 0 0 0  PHQ - 2 Score 0 0 0  Altered sleeping - 0 -  Tired, decreased energy - 1 -  Change in appetite - 1 -  Feeling bad or failure about yourself  - 0 -  Trouble concentrating - 0 -  Moving slowly or fidgety/restless - 0 -  Suicidal thoughts - 0 -  PHQ-9 Score - 2 -  Difficult doing work/chores - Not difficult at all -    No past medical history on file. Past Surgical History:  Procedure Laterality Date  . TUBAL LIGATION     Social History   Socioeconomic History  . Marital status: Married    Spouse name: Not on file  . Number of children: 2  . Years of education: associates  . Highest education level: Associate degree: occupational, Hotel manager, or vocational program  Occupational History  . Not on file  Tobacco Use  . Smoking status: Never Smoker  . Smokeless tobacco: Never Used  Vaping Use  . Vaping Use: Never used  Substance and Sexual Activity  . Alcohol use: Yes    Comment: ocassionally  . Drug use: No  .  Sexual activity: Yes    Birth control/protection: Surgical    Comment: tibual  Other Topics Concern  . Not on file  Social History Narrative  . Not on file   Social Determinants of Health   Financial Resource Strain:   . Difficulty of Paying Living Expenses: Not on file  Food Insecurity:   . Worried About Charity fundraiser in the Last Year: Not on file  . Ran Out of Food in the Last Year: Not on file  Transportation Needs:   . Lack of Transportation (Medical): Not on file  . Lack of Transportation (Non-Medical): Not on file  Physical Activity:   . Days of Exercise per Week: Not on file  . Minutes of Exercise per Session: Not on file  Stress:   . Feeling of Stress : Not on file  Social Connections:   . Frequency of Communication with Friends and Family: Not on file  . Frequency of Social Gatherings with Friends and Family: Not on file  . Attends Religious Services: Not on file  . Active Member of Clubs or Organizations: Not on file  . Attends Archivist Meetings: Not on file  . Marital Status: Not on file  Intimate Partner Violence:   . Fear of Current or Ex-Partner: Not on file  . Emotionally Abused: Not on file  . Physically Abused: Not on file  . Sexually Abused: Not on  file   Family History  Problem Relation Age of Onset  . Diabetes Paternal Grandmother    Current Outpatient Medications on File Prior to Visit  Medication Sig  . Multiple Vitamins-Calcium (ONE-A-DAY WOMENS FORMULA PO) Take by mouth.   No current facility-administered medications on file prior to visit.    Per HPI unless specifically indicated above     Objective:    BP 134/70 (BP Location: Right Arm, Patient Position: Sitting, Cuff Size: Large)   Pulse 80   Temp 98.9 F (37.2 C) (Oral)   Resp 17   Ht '5\' 8"'  (1.727 m)   Wt 227 lb 12.8 oz (103.3 kg)   LMP 12/05/2019   SpO2 100%   BMI 34.64 kg/m   Wt Readings from Last 3 Encounters:  12/13/19 227 lb 12.8 oz (103.3 kg)   03/12/18 215 lb 3.2 oz (97.6 kg)  07/20/17 204 lb 12.8 oz (92.9 kg)    Physical Exam Vitals and nursing note reviewed.  Constitutional:      General: She is not in acute distress.    Appearance: Normal appearance. She is well-developed and well-groomed. She is obese. She is not ill-appearing or toxic-appearing.  HENT:     Head: Normocephalic and atraumatic.     Right Ear: Tympanic membrane, ear canal and external ear normal. There is no impacted cerumen.     Left Ear: Tympanic membrane, ear canal and external ear normal. There is no impacted cerumen.     Nose: Nose normal. No congestion or rhinorrhea.     Mouth/Throat:     Lips: Pink.     Mouth: Mucous membranes are moist.     Pharynx: Oropharynx is clear. Uvula midline. No oropharyngeal exudate or posterior oropharyngeal erythema.  Eyes:     General: Lids are normal. Vision grossly intact. No scleral icterus.       Right eye: No discharge.        Left eye: No discharge.     Extraocular Movements: Extraocular movements intact.     Conjunctiva/sclera: Conjunctivae normal.     Pupils: Pupils are equal, round, and reactive to light.  Neck:     Thyroid: No thyroid mass or thyromegaly.  Cardiovascular:     Rate and Rhythm: Normal rate and regular rhythm.     Pulses: Normal pulses.          Dorsalis pedis pulses are 2+ on the right side and 2+ on the left side.     Heart sounds: Normal heart sounds. No murmur heard.  No friction rub. No gallop.   Pulmonary:     Effort: Pulmonary effort is normal. No respiratory distress.     Breath sounds: Normal breath sounds.  Abdominal:     General: Abdomen is flat. Bowel sounds are normal. There is no distension.     Palpations: Abdomen is soft. There is no hepatomegaly, splenomegaly or mass.     Tenderness: There is no abdominal tenderness. There is no guarding or rebound.     Hernia: No hernia is present.  Musculoskeletal:        General: Normal range of motion.     Cervical back: Normal  range of motion and neck supple. No tenderness.     Right lower leg: No edema.     Left lower leg: No edema.     Comments: Normal tone, strength 5/5 BUE & BLE  Feet:     Right foot:     Skin integrity: Skin integrity normal.  Left foot:     Skin integrity: Skin integrity normal.  Lymphadenopathy:     Cervical: No cervical adenopathy.  Skin:    General: Skin is warm and dry.     Capillary Refill: Capillary refill takes less than 2 seconds.  Neurological:     General: No focal deficit present.     Mental Status: She is alert and oriented to person, place, and time.     Cranial Nerves: No cranial nerve deficit.     Sensory: No sensory deficit.     Motor: No weakness.     Coordination: Coordination normal.     Gait: Gait normal.     Deep Tendon Reflexes: Reflexes normal.  Psychiatric:        Attention and Perception: Attention and perception normal.        Mood and Affect: Mood and affect normal.        Speech: Speech normal.        Behavior: Behavior normal. Behavior is cooperative.        Thought Content: Thought content normal.        Cognition and Memory: Cognition and memory normal.        Judgment: Judgment normal.     Results for orders placed or performed in visit on 06/05/17  TSH  Result Value Ref Range   TSH <0.01 (L) mIU/L  VITAMIN D 25 Hydroxy (Vit-D Deficiency, Fractures)  Result Value Ref Range   Vit D, 25-Hydroxy 15 (L) 30 - 100 ng/mL  Lipid panel  Result Value Ref Range   Cholesterol 232 (H) <200 mg/dL   HDL 70 >50 mg/dL   Triglycerides 37 <150 mg/dL   LDL Cholesterol (Calc) 151 (H) mg/dL (calc)   Total CHOL/HDL Ratio 3.3 <5.0 (calc)   Non-HDL Cholesterol (Calc) 162 (H) <130 mg/dL (calc)  Hemoglobin A1c  Result Value Ref Range   Hgb A1c MFr Bld 5.2 <5.7 % of total Hgb   Mean Plasma Glucose 103 (calc)   eAG (mmol/L) 5.7 (calc)  CBC with Differential/Platelet  Result Value Ref Range   WBC 4.6 3.8 - 10.8 Thousand/uL   RBC 4.44 3.80 - 5.10 Million/uL    Hemoglobin 11.7 11.7 - 15.5 g/dL   HCT 35.9 35 - 45 %   MCV 80.9 80.0 - 100.0 fL   MCH 26.4 (L) 27.0 - 33.0 pg   MCHC 32.6 32.0 - 36.0 g/dL   RDW 12.0 11.0 - 15.0 %   Platelets 253 140 - 400 Thousand/uL   MPV 10.2 7.5 - 12.5 fL   Neutro Abs 1,840 1,500 - 7,800 cells/uL   Lymphs Abs 1,789 850 - 3,900 cells/uL   WBC mixed population 520 200 - 950 cells/uL   Eosinophils Absolute 419 15.0 - 500.0 cells/uL   Basophils Absolute 32 0.0 - 200.0 cells/uL   Neutrophils Relative % 40 %   Total Lymphocyte 38.9 %   Monocytes Relative 11.3 %   Eosinophils Relative 9.1 %   Basophils Relative 0.7 %  COMPLETE METABOLIC PANEL WITH GFR  Result Value Ref Range   Glucose, Bld 85 65 - 99 mg/dL   BUN 11 7 - 25 mg/dL   Creat 0.47 (L) 0.50 - 1.10 mg/dL   GFR, Est Non African American 124 > OR = 60 mL/min/1.32m   GFR, Est African American 143 > OR = 60 mL/min/1.793m  BUN/Creatinine Ratio 23 (H) 6 - 22 (calc)   Sodium 136 135 - 146 mmol/L   Potassium 4.3 3.5 -  5.3 mmol/L   Chloride 103 98 - 110 mmol/L   CO2 28 20 - 32 mmol/L   Calcium 9.5 8.6 - 10.2 mg/dL   Total Protein 6.9 6.1 - 8.1 g/dL   Albumin 4.0 3.6 - 5.1 g/dL   Globulin 2.9 1.9 - 3.7 g/dL (calc)   AG Ratio 1.4 1.0 - 2.5 (calc)   Total Bilirubin 0.3 0.2 - 1.2 mg/dL   Alkaline phosphatase (APISO) 139 (H) 33 - 115 U/L   AST 11 10 - 30 U/L   ALT 10 6 - 29 U/L  Pap IG and HPV (high risk) DNA detection  Result Value Ref Range   Clinical Information:     LMP:     PREV. PAP:     PREV. BX:     HPV DNA Probe-Source     STATEMENT OF ADEQUACY:     GENERAL CATEGORIZATION: (A)    INTERPRETATION/RESULT: (A)    Comment:     CYTOTECHNOLOGIST:     PATHOLOGIST:     HPV DNA High Risk Not Detected Not Detect      Assessment & Plan:   Problem List Items Addressed This Visit      Other   Vitamin D deficiency    Status unknown.  Recheck labs.  Followup after labs.       Relevant Orders   Vitamin D (25 hydroxy)   Annual physical exam -  Primary    Annual physical exam without new findings.  Well adult with no acute concerns.  Plan: 1. Obtain health maintenance screenings as above according to age. - Increase physical activity to 30 minutes most days of the week.  - Eat healthy diet high in vegetables and fruits; low in refined carbohydrates. - Screening labs and tests as ordered 2. Return 1 year for annual physical.        Relevant Orders   CBC with Differential   COMPLETE METABOLIC PANEL WITH GFR   Encounter for screening mammogram for malignant neoplasm of breast    Pt last mammogram unknown.  Interested in starting mammograms due to friend with recent dx breast CA.  Plan: 1. Screening mammogram order placed.  Pt will call to schedule appointment.  Information given.       Relevant Orders   MM 3D SCREEN BREAST BILATERAL    Other Visit Diagnoses    Weight gain       Relevant Orders   TSH + free T4   Long-term use of high-risk medication       Relevant Orders   Lipid Profile      No orders of the defined types were placed in this encounter.  Follow up plan: Return in about 6 months (around 06/12/2020) for Well woman and PAP.  Harlin Rain, FNP-C Family Nurse Practitioner [facility] 12/13/2019, 3:41 PM

## 2019-12-13 NOTE — Assessment & Plan Note (Signed)
Annual physical exam without new findings.  Well adult with no acute concerns.  Plan: 1. Obtain health maintenance screenings as above according to age. - Increase physical activity to 30 minutes most days of the week.  - Eat healthy diet high in vegetables and fruits; low in refined carbohydrates. - Screening labs and tests as ordered 2. Return 1 year for annual physical.  

## 2019-12-21 ENCOUNTER — Encounter: Payer: Self-pay | Admitting: Family Medicine

## 2019-12-21 ENCOUNTER — Other Ambulatory Visit: Payer: Self-pay | Admitting: Family Medicine

## 2019-12-21 DIAGNOSIS — E559 Vitamin D deficiency, unspecified: Secondary | ICD-10-CM

## 2019-12-21 DIAGNOSIS — R946 Abnormal results of thyroid function studies: Secondary | ICD-10-CM | POA: Insufficient documentation

## 2019-12-21 LAB — COMPLETE METABOLIC PANEL WITH GFR
AG Ratio: 1.2 (calc) (ref 1.0–2.5)
ALT: 9 U/L (ref 6–29)
AST: 11 U/L (ref 10–30)
Albumin: 3.8 g/dL (ref 3.6–5.1)
Alkaline phosphatase (APISO): 115 U/L (ref 31–125)
BUN/Creatinine Ratio: 18 (calc) (ref 6–22)
BUN: 9 mg/dL (ref 7–25)
CO2: 25 mmol/L (ref 20–32)
Calcium: 9.2 mg/dL (ref 8.6–10.2)
Chloride: 104 mmol/L (ref 98–110)
Creat: 0.49 mg/dL — ABNORMAL LOW (ref 0.50–1.10)
GFR, Est African American: 138 mL/min/{1.73_m2} (ref >=60)
GFR, Est Non African American: 119 mL/min/{1.73_m2} (ref >=60)
Globulin: 3.1 g/dL (calc) (ref 1.9–3.7)
Glucose, Bld: 90 mg/dL (ref 65–99)
Potassium: 3.9 mmol/L (ref 3.5–5.3)
Sodium: 137 mmol/L (ref 135–146)
Total Bilirubin: 0.4 mg/dL (ref 0.2–1.2)
Total Protein: 6.9 g/dL (ref 6.1–8.1)

## 2019-12-21 LAB — CBC WITH DIFFERENTIAL/PLATELET
Absolute Monocytes: 525 cells/uL (ref 200–950)
Basophils Absolute: 21 cells/uL (ref 0–200)
Basophils Relative: 0.5 %
Eosinophils Absolute: 152 cells/uL (ref 15–500)
Eosinophils Relative: 3.7 %
HCT: 38.2 % (ref 35.0–45.0)
Hemoglobin: 12.8 g/dL (ref 11.7–15.5)
Lymphs Abs: 1587 cells/uL (ref 850–3900)
MCH: 27.7 pg (ref 27.0–33.0)
MCHC: 33.5 g/dL (ref 32.0–36.0)
MCV: 82.7 fL (ref 80.0–100.0)
MPV: 10.9 fL (ref 7.5–12.5)
Monocytes Relative: 12.8 %
Neutro Abs: 1816 cells/uL (ref 1500–7800)
Neutrophils Relative %: 44.3 %
Platelets: 250 10*3/uL (ref 140–400)
RBC: 4.62 10*6/uL (ref 3.80–5.10)
RDW: 12.4 % (ref 11.0–15.0)
Total Lymphocyte: 38.7 %
WBC: 4.1 10*3/uL (ref 3.8–10.8)

## 2019-12-21 LAB — TSH+FREE T4: TSH W/REFLEX TO FT4: <0.01 mIU/L — ABNORMAL LOW

## 2019-12-21 LAB — LIPID PANEL
Cholesterol: 224 mg/dL — ABNORMAL HIGH (ref <200)
HDL: 65 mg/dL (ref >=50)
LDL Cholesterol (Calc): 144 mg/dL (calc) — ABNORMAL HIGH
Non-HDL Cholesterol (Calc): 159 mg/dL (calc) — ABNORMAL HIGH (ref <130)
Total CHOL/HDL Ratio: 3.4 (calc) (ref <5.0)
Triglycerides: 63 mg/dL (ref <150)

## 2019-12-21 LAB — T4, FREE: Free T4: 3.7 ng/dL — ABNORMAL HIGH (ref 0.8–1.8)

## 2019-12-21 LAB — VITAMIN D 25 HYDROXY (VIT D DEFICIENCY, FRACTURES): Vit D, 25-Hydroxy: 17 ng/mL — ABNORMAL LOW (ref 30–100)

## 2019-12-22 MED ORDER — VITAMIN D (ERGOCALCIFEROL) 1.25 MG (50000 UNIT) PO CAPS: 50000.0000 [IU] | ORAL_CAPSULE | ORAL | 0 refills | Status: DC

## 2019-12-22 NOTE — Telephone Encounter (Signed)
Rx sent to pharmacy on file.

## 2020-07-02 ENCOUNTER — Other Ambulatory Visit: Payer: Self-pay

## 2020-07-02 ENCOUNTER — Encounter: Payer: Self-pay | Admitting: Internal Medicine

## 2020-07-02 ENCOUNTER — Ambulatory Visit: Payer: BC Managed Care – PPO | Admitting: Internal Medicine

## 2020-07-02 VITALS — BP 151/85 | HR 66 | Temp 97.7°F | Wt 239.0 lb

## 2020-07-02 DIAGNOSIS — E0590 Thyrotoxicosis, unspecified without thyrotoxic crisis or storm: Secondary | ICD-10-CM

## 2020-07-02 DIAGNOSIS — N926 Irregular menstruation, unspecified: Secondary | ICD-10-CM | POA: Diagnosis not present

## 2020-07-02 DIAGNOSIS — E6609 Other obesity due to excess calories: Secondary | ICD-10-CM

## 2020-07-02 DIAGNOSIS — Z6836 Body mass index (BMI) 36.0-36.9, adult: Secondary | ICD-10-CM

## 2020-07-02 DIAGNOSIS — R87610 Atypical squamous cells of undetermined significance on cytologic smear of cervix (ASC-US): Secondary | ICD-10-CM | POA: Diagnosis not present

## 2020-07-02 NOTE — Progress Notes (Signed)
Subjective:    Patient ID: Michelle Branch, female    DOB: Nov 05, 1975, 45 y.o.   MRN: [id number]  HPI  Pt presents to the clinic today for follow up of abnormal pap. Her par from 10/2017 showed ASCUS without high risk HPV. She was referred to Dr. Hildred Laser at that time. She recommend repeat pap in 3-5 years. Patient denies having any vaginal issues at this time other than an irregular menses this month. She reports she started her menses on 4/25 and then again on 5/10. This has never happened to her before. She denies pelvic pain, vaginal discharge or odor. She has no family history of ovarian, uterine, cervical or vulvar cancer. She has no idea at what age her mother went through menopause.  She is also due to follow up on her abnormal thyroid levels. TSH < 0.01 % with Free T4 of 3.4, 12/2019. She was referred to endocrinology at that time but reports the endocrinologist did not accept her insurance and wanted a 160 dollars up front for her to be seen. She chose not to pay this and thought she could have repeat labs today. She has no family history of thyroid disorders.  Review of Systems      No past medical history on file.  Current Outpatient Medications  Medication Sig Dispense Refill  . Multiple Vitamins-Calcium (ONE-A-DAY WOMENS FORMULA PO) Take by mouth.    . Vitamin D, Ergocalciferol, (DRISDOL) 1.25 MG (50000 UNIT) CAPS capsule Take 1 capsule (50,000 Units total) by mouth every 7 (seven) days. 12 capsule 0   No current facility-administered medications for this visit.    No Known Allergies  Family History  Problem Relation Age of Onset  . Diabetes Paternal Grandmother     Social History   Socioeconomic History  . Marital status: Married    Spouse name: Not on file  . Number of children: 2  . Years of education: associates  . Highest education level: Associate degree: occupational, Scientist, product/process development, or vocational program  Occupational History  . Not on file  Tobacco Use   . Smoking status: Never Smoker  . Smokeless tobacco: Never Used  Vaping Use  . Vaping Use: Never used  Substance and Sexual Activity  . Alcohol use: Yes    Comment: ocassionally  . Drug use: No  . Sexual activity: Yes    Birth control/protection: Surgical    Comment: tibual  Other Topics Concern  . Not on file  Social History Narrative  . Not on file   Social Determinants of Health   Financial Resource Strain: Not on file  Food Insecurity: Not on file  Transportation Needs: Not on file  Physical Activity: Not on file  Stress: Not on file  Social Connections: Not on file  Intimate Partner Violence: Not on file     Constitutional: Denies fever, malaise, fatigue, headache or abrupt weight changes.  Respiratory: Denies difficulty breathing, shortness of breath, cough or sputum production.   Cardiovascular: Denies chest pain, chest tightness, palpitations or swelling in the hands or feet.  Gastrointestinal: Denies abdominal pain, bloating, constipation, diarrhea or blood in the stool.  GU: Pt reports irregular menses. Denies urgency, frequency, pain with urination, burning sensation, blood in urine, odor or discharge. Neurological: Denies dizziness, difficulty with memory, difficulty with speech or problems with balance and coordination.    No other specific complaints in a complete review of systems (except as listed in HPI above).  Objective:   Physical Exam   BP Marland Kitchen)  151/85 (BP Location: Right Arm, Patient Position: Sitting, Cuff Size: Large)   Pulse 66   Temp 97.7 F (36.5 C) (Oral)   Wt 239 lb (108.4 kg)   SpO2 100%   BMI 36.34 kg/m  Wt Readings from Last 3 Encounters:  07/02/20 239 lb (108.4 kg)  12/13/19 227 lb 12.8 oz (103.3 kg)  03/12/18 215 lb 3.2 oz (97.6 kg)    General: Appears her stated age, obese, in NAD.  HEENT: Head: normal shape and size; Eyes: sclera white and EOMs intact;  Neck:  Neck supple, trachea midline. Thyromegaly noted.   Cardiovascular: Normal rate and rhythm.  Pulmonary/Chest: Normal effort and positive vesicular breath sounds.  Musculoskeletal:  No difficulty with gait.  Neurological: Alert and oriented.  Psychiatric: Mood and affect normal. Behavior is normal. Judgment and thought content normal.    BMET    Component Value Date/Time   NA 137 12/20/2019 0752   K 3.9 12/20/2019 0752   CL 104 12/20/2019 0752   CO2 25 12/20/2019 0752   GLUCOSE 90 12/20/2019 0752   BUN 9 12/20/2019 0752   CREATININE 0.49 (L) 12/20/2019 0752   CALCIUM 9.2 12/20/2019 0752   GFRNONAA 119 12/20/2019 0752   GFRAA 138 12/20/2019 0752    Lipid Panel     Component Value Date/Time   CHOL 224 (H) 12/20/2019 0752   TRIG 63 12/20/2019 0752   HDL 65 12/20/2019 0752   CHOLHDL 3.4 12/20/2019 0752   LDLCALC 144 (H) 12/20/2019 0752    CBC    Component Value Date/Time   WBC 4.1 12/20/2019 0752   RBC 4.62 12/20/2019 0752   HGB 12.8 12/20/2019 0752   HCT 38.2 12/20/2019 0752   PLT 250 12/20/2019 0752   MCV 82.7 12/20/2019 0752   MCH 27.7 12/20/2019 0752   MCHC 33.5 12/20/2019 0752   RDW 12.4 12/20/2019 0752   LYMPHSABS 1,587 12/20/2019 0752   EOSABS 152 12/20/2019 0752   BASOSABS 21 12/20/2019 0752    Hgb A1C Lab Results  Component Value Date   HGBA1C 5.2 06/05/2017           Assessment & Plan:  Irregular Menses, Hyperthyroidism:  DDx include menorrhagia, early menopause, hyperthyroidism Will check TSH, Free T4, T3 and TPO antibodies today Referral to endocrinology placed If endocrinology appt is far out, will consider thyroid ultrasound  Abnormal Pap Smear:  Reviewed las pap Reviewed Dr. Oretha Milch note Given the fact that she does not feel like she is having any issues at this time, will repeat pap in 2 years  Will follow up after labs, return precautions discussed  Nicki Reaper, NP This visit occurred during the SARS-CoV-2 public health emergency.  Safety protocols were in place, including  screening questions prior to the visit, additional usage of staff PPE, and extensive cleaning of exam room while observing appropriate contact time as indicated for disinfecting solutions.

## 2020-07-02 NOTE — Patient Instructions (Signed)

## 2020-07-05 ENCOUNTER — Encounter: Payer: Self-pay | Admitting: Internal Medicine

## 2020-07-05 ENCOUNTER — Other Ambulatory Visit: Payer: BC Managed Care – PPO

## 2020-07-05 ENCOUNTER — Other Ambulatory Visit: Payer: Self-pay

## 2020-07-05 DIAGNOSIS — E0590 Thyrotoxicosis, unspecified without thyrotoxic crisis or storm: Secondary | ICD-10-CM

## 2020-07-05 DIAGNOSIS — N926 Irregular menstruation, unspecified: Secondary | ICD-10-CM

## 2020-07-06 ENCOUNTER — Encounter: Payer: Self-pay | Admitting: Internal Medicine

## 2020-07-06 LAB — FSH/LH
FSH: 14.4 m[IU]/mL
LH: 8.7 m[IU]/mL

## 2020-07-06 LAB — T3: T3, Total: 184 ng/dL — ABNORMAL HIGH (ref 76–181)

## 2020-07-06 LAB — TSH: TSH: <0.01 mIU/L — ABNORMAL LOW

## 2020-07-06 LAB — THYROID PEROXIDASE ANTIBODIES (TPO) (REFL): Thyroperoxidase Ab SerPl-aCnc: 757 IU/mL — ABNORMAL HIGH (ref <9)

## 2020-07-06 LAB — T4, FREE: Free T4: 2.5 ng/dL — ABNORMAL HIGH (ref 0.8–1.8)

## 2020-07-09 NOTE — Telephone Encounter (Signed)
This is not a new diagnosis.  This is an old diagnosis but she has not been able to see rheumatology because she was referred to somewhere outside of her network.  I would like to see if she could be seen sooner.

## 2020-07-10 NOTE — Telephone Encounter (Signed)
Noted. Thank you for checking.

## 2020-09-17 ENCOUNTER — Telehealth: Payer: Self-pay

## 2020-09-17 NOTE — Telephone Encounter (Signed)
Copied from CRM [id number]. Topic: General - Inquiry >> Sep 17, 2020  4:17 PM Elliot Gault wrote: Patient was referred to Endocrinologist who is not in network, patient states this is the 2nd time she was referred to a out of network doctor. Patient found a in Therapist, occupational and would like referral placed with Dr. Horton Chin  [address] [place], [place] [address] [phone]. Please send a my chart message informing patient referral has been sent.

## 2020-09-18 NOTE — Telephone Encounter (Signed)
Why is she being referred somewhere her insurance does not cover? Do I need to put in another referral?

## 2020-10-16 ENCOUNTER — Telehealth: Payer: Self-pay

## 2020-10-16 DIAGNOSIS — E0590 Thyrotoxicosis, unspecified without thyrotoxic crisis or storm: Secondary | ICD-10-CM

## 2020-10-16 NOTE — Addendum Note (Signed)
Addended by: Lorre Munroe on: 10/16/2020 12:30 PM   Modules accepted: Orders

## 2020-10-16 NOTE — Telephone Encounter (Signed)
Copied from CRM [id number]. Topic: Referral - Request for Referral >> Oct 15, 2020  2:11 PM Aretta Nip wrote: Has patient seen PCP for this complaint? Yes.    *If NO, is insurance requiring patient see PCP for this issue before PCP can refer them?  Referral for which specialty: endocrinology     Provider # [id number] does not have fax#  Preferred provider/office: Dr Horton Chin [phone] [facility] [phone]  Reason for referral: pt received referral in May to [facility] but not in network.  Pt has checked around and this provider is in her network, asking to resubmit.

## 2020-10-16 NOTE — Telephone Encounter (Signed)
Referral placed.

## 2021-09-16 ENCOUNTER — Encounter: Payer: Self-pay | Admitting: Internal Medicine

## 2021-09-16 ENCOUNTER — Other Ambulatory Visit (HOSPITAL_COMMUNITY)
Admission: RE | Admit: 2021-09-16 | Discharge: 2021-09-16 | Disposition: A | Discharge status: Home / Self Care | Payer: BC Managed Care – PPO | Source: Ambulatory Visit | Attending: Internal Medicine | Admitting: Internal Medicine

## 2021-09-16 ENCOUNTER — Ambulatory Visit (INDEPENDENT_AMBULATORY_CARE_PROVIDER_SITE_OTHER): Payer: BC Managed Care – PPO | Admitting: Internal Medicine

## 2021-09-16 VITALS — BP 126/84 | HR 75 | Temp 98.1°F | Ht 69.0 in | Wt 268.0 lb

## 2021-09-16 DIAGNOSIS — E7800 Pure hypercholesterolemia, unspecified: Secondary | ICD-10-CM | POA: Diagnosis not present

## 2021-09-16 DIAGNOSIS — Z0001 Encounter for general adult medical examination with abnormal findings: Secondary | ICD-10-CM

## 2021-09-16 DIAGNOSIS — Z23 Encounter for immunization: Secondary | ICD-10-CM | POA: Diagnosis not present

## 2021-09-16 DIAGNOSIS — Z114 Encounter for screening for human immunodeficiency virus [HIV]: Secondary | ICD-10-CM

## 2021-09-16 DIAGNOSIS — Z1159 Encounter for screening for other viral diseases: Secondary | ICD-10-CM

## 2021-09-16 DIAGNOSIS — N3941 Urge incontinence: Secondary | ICD-10-CM

## 2021-09-16 DIAGNOSIS — Z124 Encounter for screening for malignant neoplasm of cervix: Secondary | ICD-10-CM

## 2021-09-16 DIAGNOSIS — E0590 Thyrotoxicosis, unspecified without thyrotoxic crisis or storm: Secondary | ICD-10-CM

## 2021-09-16 DIAGNOSIS — Z1231 Encounter for screening mammogram for malignant neoplasm of breast: Secondary | ICD-10-CM

## 2021-09-16 DIAGNOSIS — Z6839 Body mass index (BMI) 39.0-39.9, adult: Secondary | ICD-10-CM

## 2021-09-16 DIAGNOSIS — E6609 Other obesity due to excess calories: Secondary | ICD-10-CM | POA: Insufficient documentation

## 2021-09-16 DIAGNOSIS — Z1211 Encounter for screening for malignant neoplasm of colon: Secondary | ICD-10-CM

## 2021-09-16 NOTE — Assessment & Plan Note (Signed)
Encourage diet and exercise for weight loss 

## 2021-09-16 NOTE — Addendum Note (Signed)
Addended by: Kavin Leech E on: 09/16/2021 04:16 PM   Modules accepted: Orders

## 2021-09-16 NOTE — Progress Notes (Signed)
Subjective:    Patient ID: Michelle Branch, female    DOB: Oct 05, 1975, 46 y.o.   MRN: [id number]  HPI  Patient presents to clinic today for her annual exam.  Flu: never Tetanus: < 10 years ago COVID: Pfizer x 2 Pap smear: 05/2017, abnormal Mammogram: Never Colon screening: Never Vision screening: annually Dentist: biannually  Diet: She does eat meat. She consumes fruits and  veggies. She does eat some fried foods. She drinks mostly gatorade and water. Exercise: Gym 3 x week  Review of Systems     No past medical history on file.  Current Outpatient Medications  Medication Sig Dispense Refill   Multiple Vitamins-Calcium (ONE-A-DAY WOMENS FORMULA PO) Take by mouth.     Vitamin D, Ergocalciferol, (DRISDOL) 1.25 MG (50000 UNIT) CAPS capsule Take 1 capsule (50,000 Units total) by mouth every 7 (seven) days. 12 capsule 0   No current facility-administered medications for this visit.    No Known Allergies  Family History  Problem Relation Age of Onset   Diabetes Paternal Grandmother     Social History   Socioeconomic History   Marital status: Married    Spouse name: Not on file   Number of children: 2   Years of education: associates   Highest education level: Associate degree: occupational, Hotel manager, or vocational program  Occupational History   Not on file  Tobacco Use   Smoking status: Never   Smokeless tobacco: Never  Vaping Use   Vaping Use: Never used  Substance and Sexual Activity   Alcohol use: Yes    Comment: ocassionally   Drug use: No   Sexual activity: Yes    Birth control/protection: Surgical    Comment: tibual  Other Topics Concern   Not on file  Social History Narrative   Not on file   Social Determinants of Health   Financial Resource Strain: Not on file  Food Insecurity: Not on file  Transportation Needs: Not on file  Physical Activity: Not on file  Stress: Not on file  Social Connections: Not on file  Intimate Partner Violence:  Not At Risk (04/10/2017)   Humiliation, Afraid, Rape, and Kick questionnaire    Fear of Current or Ex-Partner: No    Emotionally Abused: No    Physically Abused: No    Sexually Abused: No     Constitutional: Patient reports weight gain.  Denies fever, malaise, fatigue, headache.  HEENT: Denies eye pain, eye redness, ear pain, ringing in the ears, wax buildup, runny nose, nasal congestion, bloody nose, or sore throat. Respiratory: Denies difficulty breathing, shortness of breath, cough or sputum production.   Cardiovascular: Denies chest pain, chest tightness, palpitations or swelling in the hands or feet.  Gastrointestinal: Denies abdominal pain, bloating, constipation, diarrhea or blood in the stool.  GU: Patient reports urge incontinence.  Denies urgency, frequency, pain with urination, burning sensation, blood in urine, odor or discharge. Musculoskeletal: Denies decrease in range of motion, difficulty with gait, muscle pain or joint pain and swelling.  Skin: Denies redness, rashes, lesions or ulcercations.  Neurological: Denies dizziness, difficulty with memory, difficulty with speech or problems with balance and coordination.  Psych: Denies anxiety, depression, SI/HI.  No other specific complaints in a complete review of systems (except as listed in HPI above).  Objective:   Physical Exam  BP 126/84 (BP Location: Left Arm, Patient Position: Sitting, Cuff Size: Large)   Pulse 75   Temp 98.1 F (36.7 C) (Oral)   Ht '5\' 9"'  (1.753 m)  Wt 268 lb (121.6 kg)   SpO2 100%   BMI 39.58 kg/m   Wt Readings from Last 3 Encounters:  07/02/20 239 lb (108.4 kg)  12/13/19 227 lb 12.8 oz (103.3 kg)  03/12/18 215 lb 3.2 oz (97.6 kg)    General: Appears her stated age, obese, in NAD. Skin: Warm, dry and intact. HEENT: Head: normal shape and size; Eyes: sclera white, no icterus, conjunctiva pink, PERRLA and EOMs intact;  Neck:  Neck supple, trachea midline. No masses, lumps present.   Thyromegaly noted. Cardiovascular: Normal rate and rhythm. S1,S2 noted.  No murmur, rubs or gallops noted.  Trace pitting BLE edema.  Pulmonary/Chest: Normal effort and positive vesicular breath sounds. No respiratory distress. No wheezes, rales or ronchi noted.  Abdomen: Soft and nontender. Normal bowel sounds.  Pelvic: Normal female anatomy.  Cervix not visualized.  No CMT.  Adnexa nonpalpable. Musculoskeletal: Strength 5/5 BUE/BLE.  No difficulty with gait.  Neurological: Alert and oriented. Cranial nerves II-XII grossly intact. Coordination normal.    BMET    Component Value Date/Time   NA 137 12/20/2019 0752   K 3.9 12/20/2019 0752   CL 104 12/20/2019 0752   CO2 25 12/20/2019 0752   GLUCOSE 90 12/20/2019 0752   BUN 9 12/20/2019 0752   CREATININE 0.49 (L) 12/20/2019 0752   CALCIUM 9.2 12/20/2019 0752   GFRNONAA 119 12/20/2019 0752   GFRAA 138 12/20/2019 0752    Lipid Panel     Component Value Date/Time   CHOL 224 (H) 12/20/2019 0752   TRIG 63 12/20/2019 0752   HDL 65 12/20/2019 0752   CHOLHDL 3.4 12/20/2019 0752   LDLCALC 144 (H) 12/20/2019 0752    CBC    Component Value Date/Time   WBC 4.1 12/20/2019 0752   RBC 4.62 12/20/2019 0752   HGB 12.8 12/20/2019 0752   HCT 38.2 12/20/2019 0752   PLT 250 12/20/2019 0752   MCV 82.7 12/20/2019 0752   MCH 27.7 12/20/2019 0752   MCHC 33.5 12/20/2019 0752   RDW 12.4 12/20/2019 0752   LYMPHSABS 1,587 12/20/2019 0752   EOSABS 152 12/20/2019 0752   BASOSABS 21 12/20/2019 0752    Hgb A1C Lab Results  Component Value Date   HGBA1C 5.2 06/05/2017          Assessment & Plan:   Preventative Health Maintenance:  Encouraged her to get a flu shot in the fall Tetanus today Encouraged her to get her COVID booster Pap smear today, no STD screening Mammogram ordered-she will call to schedule Referral to GI for screening colonoscopy Encouraged her to consume a balanced diet and exercise regimen Advised her to see an eye  doctor and dentist annually We will check CBC, c-Met, TSH, free T4, lipid, A1c, HIV, hep C today  RTC in 6 months, follow-up chronic conditions Webb Silversmith, NP

## 2021-09-16 NOTE — Patient Instructions (Signed)

## 2021-09-16 NOTE — Assessment & Plan Note (Signed)
Avoid caffeine Encouraged timed voiding

## 2021-09-17 ENCOUNTER — Other Ambulatory Visit: Payer: Self-pay

## 2021-09-17 ENCOUNTER — Telehealth: Payer: Self-pay

## 2021-09-17 DIAGNOSIS — Z1211 Encounter for screening for malignant neoplasm of colon: Secondary | ICD-10-CM

## 2021-09-17 LAB — CBC
HCT: 36.5 % (ref 35.0–45.0)
Hemoglobin: 11.9 g/dL (ref 11.7–15.5)
MCH: 29.3 pg (ref 27.0–33.0)
MCHC: 32.6 g/dL (ref 32.0–36.0)
MCV: 89.9 fL (ref 80.0–100.0)
MPV: 10.1 fL (ref 7.5–12.5)
Platelets: 301 10*3/uL (ref 140–400)
RBC: 4.06 10*6/uL (ref 3.80–5.10)
RDW: 12.5 % (ref 11.0–15.0)
WBC: 3.3 10*3/uL — ABNORMAL LOW (ref 3.8–10.8)

## 2021-09-17 LAB — LIPID PANEL
Cholesterol: 301 mg/dL — ABNORMAL HIGH (ref <200)
HDL: 84 mg/dL (ref >=50)
LDL Cholesterol (Calc): 202 mg/dL (calc) — ABNORMAL HIGH
Non-HDL Cholesterol (Calc): 217 mg/dL (calc) — ABNORMAL HIGH (ref <130)
Total CHOL/HDL Ratio: 3.6 (calc) (ref <5.0)
Triglycerides: 54 mg/dL (ref <150)

## 2021-09-17 LAB — COMPLETE METABOLIC PANEL WITH GFR
AG Ratio: 1.3 (calc) (ref 1.0–2.5)
ALT: 9 U/L (ref 6–29)
AST: 12 U/L (ref 10–35)
Albumin: 3.9 g/dL (ref 3.6–5.1)
Alkaline phosphatase (APISO): 80 U/L (ref 31–125)
BUN: 13 mg/dL (ref 7–25)
CO2: 26 mmol/L (ref 20–32)
Calcium: 8.7 mg/dL (ref 8.6–10.2)
Chloride: 105 mmol/L (ref 98–110)
Creat: 0.73 mg/dL (ref 0.50–0.99)
Globulin: 3.1 g/dL (calc) (ref 1.9–3.7)
Glucose, Bld: 80 mg/dL (ref 65–99)
Potassium: 4.3 mmol/L (ref 3.5–5.3)
Sodium: 138 mmol/L (ref 135–146)
Total Bilirubin: 0.2 mg/dL (ref 0.2–1.2)
Total Protein: 7 g/dL (ref 6.1–8.1)
eGFR: 103 mL/min/{1.73_m2} (ref >=60)

## 2021-09-17 LAB — HIV ANTIBODY (ROUTINE TESTING W REFLEX): HIV 1&2 Ab, 4th Generation: NONREACTIVE

## 2021-09-17 LAB — TSH: TSH: 6.54 mIU/L — ABNORMAL HIGH

## 2021-09-17 LAB — HEMOGLOBIN A1C
Hgb A1c MFr Bld: 5.2 % of total Hgb (ref <5.7)
Mean Plasma Glucose: 103 mg/dL
eAG (mmol/L): 5.7 mmol/L

## 2021-09-17 LAB — HEPATITIS C ANTIBODY: Hepatitis C Ab: NONREACTIVE

## 2021-09-17 LAB — T4, FREE: Free T4: 0.7 ng/dL — ABNORMAL LOW (ref 0.8–1.8)

## 2021-09-17 MED ORDER — NA SULFATE-K SULFATE-MG SULF 17.5-3.13-1.6 GM/177ML PO SOLN: 1.0000 | Freq: Once | ORAL | 0 refills | Status: AC

## 2021-09-17 NOTE — Telephone Encounter (Signed)
-----   Message from Lorre Munroe, NP sent at 09/17/2021  8:38 AM EDT ----- WBC count is marginally low.  This is not concerning at this time but something I will monitor.  TSH is slightly elevated and free T4 is low.  I would recommend you follow-up with your endocrinologist about this.  Cholesterol is very elevated.  I would recommend you start cholesterol-lowering medication to reduce your risk of heart attack and stroke.  Let me know if she is agreeable and I will send this in.  She will need repeat lipid panel in 3 months, lab only.  Liver and kidney function is normal.  You do not have diabetes.

## 2021-09-17 NOTE — Telephone Encounter (Signed)
LMTCB 09/17/2021.  PEC please advise pt when she calls back.   Thanks,   -Vernona Rieger

## 2021-09-17 NOTE — Telephone Encounter (Signed)
Gastroenterology Pre-Procedure Review  Request Date: 11/11/21 Requesting Physician: Dr. Tobi Bastos  PATIENT REVIEW QUESTIONS: The patient responded to the following health history questions as indicated:    1. Are you having any GI issues? no 2. Do you have a personal history of Polyps? no 3. Do you have a family history of Colon Cancer or Polyps? no 4. Diabetes Mellitus? no 5. Joint replacements in the past 12 months?no 6. Major health problems in the past 3 months?no 7. Any artificial heart valves, MVP, or defibrillator?no    MEDICATIONS & ALLERGIES:    Patient reports the following regarding taking any anticoagulation/antiplatelet therapy:   Plavix, Coumadin, Eliquis, Xarelto, Lovenox, Pradaxa, Brilinta, or Effient? no Aspirin? no  Patient confirms/reports the following medications:  Current Outpatient Medications  Medication Sig Dispense Refill   methimazole (TAPAZOLE) 10 MG tablet Take 10 mg by mouth 3 (three) times daily.     Multiple Vitamins-Calcium (ONE-A-DAY WOMENS FORMULA PO) Take by mouth.     No current facility-administered medications for this visit.    Patient confirms/reports the following allergies:  No Known Allergies  No orders of the defined types were placed in this encounter.   AUTHORIZATION INFORMATION Primary Insurance: 1D#: Group #:  Secondary Insurance: 1D#: Group #:  SCHEDULE INFORMATION: Date: 11/11/21 Time: Location: [facility]

## 2021-09-19 LAB — CYTOLOGY - PAP
Comment: NEGATIVE
Diagnosis: NEGATIVE
High risk HPV: NEGATIVE

## 2021-11-07 ENCOUNTER — Other Ambulatory Visit: Payer: Self-pay

## 2021-11-07 DIAGNOSIS — Z1211 Encounter for screening for malignant neoplasm of colon: Secondary | ICD-10-CM

## 2021-11-11 ENCOUNTER — Ambulatory Visit: Payer: BC Managed Care – PPO

## 2021-11-11 ENCOUNTER — Encounter: Payer: Self-pay | Admitting: Gastroenterology

## 2021-11-11 ENCOUNTER — Ambulatory Visit
Admission: RE | Admit: 2021-11-11 | Discharge: 2021-11-11 | Disposition: A | Discharge status: Home / Self Care | Payer: BC Managed Care – PPO | Attending: Gastroenterology | Admitting: Gastroenterology

## 2021-11-11 ENCOUNTER — Encounter
Admission: RE | Disposition: A | Discharge status: Home / Self Care | Payer: Self-pay | Source: Home / Self Care | Attending: Gastroenterology

## 2021-11-11 DIAGNOSIS — E669 Obesity, unspecified: Secondary | ICD-10-CM | POA: Insufficient documentation

## 2021-11-11 DIAGNOSIS — Z6838 Body mass index (BMI) 38.0-38.9, adult: Secondary | ICD-10-CM | POA: Diagnosis not present

## 2021-11-11 DIAGNOSIS — Z1211 Encounter for screening for malignant neoplasm of colon: Secondary | ICD-10-CM | POA: Insufficient documentation

## 2021-11-11 HISTORY — PX: COLONOSCOPY WITH PROPOFOL: SHX5780

## 2021-11-11 LAB — POCT PREGNANCY, URINE: Preg Test, Ur: NEGATIVE

## 2021-11-11 SURGERY — COLONOSCOPY WITH PROPOFOL
Anesthesia: General

## 2021-11-11 MED ORDER — PROPOFOL 1000 MG/100ML IV EMUL
INTRAVENOUS | Status: AC
  Filled 2021-11-11: qty 200

## 2021-11-11 MED ORDER — GLYCOPYRROLATE 0.2 MG/ML IJ SOLN
INTRAMUSCULAR | Status: AC
  Filled 2021-11-11: qty 1

## 2021-11-11 MED ORDER — LIDOCAINE HCL (PF) 2 % IJ SOLN
INTRAMUSCULAR | Status: AC
  Filled 2021-11-11: qty 5

## 2021-11-11 MED ORDER — SODIUM CHLORIDE 0.9 % IV SOLN: INTRAVENOUS | Status: DC

## 2021-11-11 MED ORDER — PROPOFOL 500 MG/50ML IV EMUL
INTRAVENOUS | Status: DC | PRN
  Administered 2021-11-11: 140 ug/kg/min via INTRAVENOUS

## 2021-11-11 MED ORDER — PROPOFOL 10 MG/ML IV BOLUS
INTRAVENOUS | Status: DC | PRN
  Administered 2021-11-11: 30 mg via INTRAVENOUS
  Administered 2021-11-11: 70 mg via INTRAVENOUS
  Administered 2021-11-11: 30 mg via INTRAVENOUS

## 2021-11-11 MED ORDER — LIDOCAINE HCL (CARDIAC) PF 100 MG/5ML IV SOSY
PREFILLED_SYRINGE | INTRAVENOUS | Status: DC | PRN
  Administered 2021-11-11: 50 mg via INTRAVENOUS

## 2021-11-11 NOTE — Anesthesia Preprocedure Evaluation (Signed)
Anesthesia Evaluation  Patient identified by MRN, date of birth, ID band Patient awake    Reviewed: Allergy & Precautions, H&P , NPO status , Patient's Chart, lab work & pertinent test results, reviewed documented beta blocker date and time   History of Anesthesia Complications Negative for: history of anesthetic complications  Airway Mallampati: III  TM Distance: >3 FB Neck ROM: full    Dental  (+) Dental Advidsory Given, Teeth Intact, Missing   Pulmonary neg pulmonary ROS,    Pulmonary exam normal breath sounds clear to auscultation       Cardiovascular Exercise Tolerance: Good negative cardio ROS Normal cardiovascular exam Rhythm:regular Rate:Normal     Neuro/Psych negative neurological ROS  negative psych ROS   GI/Hepatic negative GI ROS, Neg liver ROS,   Endo/Other  neg diabetesHyperthyroidism   Renal/GU negative Renal ROS  negative genitourinary   Musculoskeletal   Abdominal   Peds  Hematology negative hematology ROS (+)   Anesthesia Other Findings History reviewed. No pertinent past medical history. Obesity  Reproductive/Obstetrics negative OB ROS                             Anesthesia Physical Anesthesia Plan  ASA: 2  Anesthesia Plan: General   Post-op Pain Management:    Induction: Intravenous  PONV Risk Score and Plan: 3 and Propofol infusion and TIVA  Airway Management Planned: Natural Airway and Nasal Cannula  Additional Equipment:   Intra-op Plan:   Post-operative Plan:   Informed Consent: I have reviewed the patients History and Physical, chart, labs and discussed the procedure including the risks, benefits and alternatives for the proposed anesthesia with the patient or authorized representative who has indicated his/her understanding and acceptance.     Dental Advisory Given  Plan Discussed with: Anesthesiologist, CRNA and Surgeon  Anesthesia Plan  Comments:         Anesthesia Quick Evaluation

## 2021-11-11 NOTE — Op Note (Signed)
[facility] Patient Name: Michelle Branch Procedure Date: 11/11/2021 7:14 AM MRN: [id number] Account #: [id number] Date of Birth: 1976/01/12 Admit Type: Outpatient Age: 46 Room: [facility] ENDO ROOM 1 Gender: Female Note Status: Finalized Instrument Name: Jasper Riling [id number] Procedure:             Colonoscopy Indications:           Screening for colorectal malignant neoplasm Providers:             Jonathon Bellows MD, MD Referring MD:          Jearld Fenton (Referring MD) Medicines:             Monitored Anesthesia Care Complications:         No immediate complications. Procedure:             Pre-Anesthesia Assessment:                        - Prior to the procedure, a History and Physical was                         performed, and patient medications, allergies and                         sensitivities were reviewed. The patient's tolerance                         of previous anesthesia was reviewed.                        - The risks and benefits of the procedure and the                         sedation options and risks were discussed with the                         patient. All questions were answered and informed                         consent was obtained.                        - ASA Grade Assessment: II - A patient with mild                         systemic disease.                        After obtaining informed consent, the colonoscope was                         passed under direct vision. Throughout the procedure,                         the patient's blood pressure, pulse, and oxygen                         saturations were monitored continuously. The                         Colonoscope was introduced  through the anus and                         advanced to the the cecum, identified by the                         appendiceal orifice. The colonoscopy was performed                         with ease. The patient tolerated the procedure well.                          The quality of the bowel preparation was good. Findings:      The perianal and digital rectal examinations were normal.      The entire examined colon appeared normal on direct and retroflexion       views. Impression:            - The entire examined colon is normal on direct and                         retroflexion views.                        - No specimens collected. Recommendation:        - Discharge patient to home (with escort).                        - Resume previous diet.                        - Continue present medications.                        - Repeat colonoscopy in 10 years for screening                         purposes. Procedure Code(s):     --- Professional ---                        [phone], Colonoscopy, flexible; diagnostic, including                         collection of specimen(s) by brushing or washing, when                         performed (separate procedure) Diagnosis Code(s):     --- Professional ---                        Z12.11, Encounter for screening for malignant neoplasm                         of colon CPT copyright 2019 [employer]. All rights reserved. The codes documented in this report are preliminary and upon coder review may  be revised to meet current compliance requirements. Jonathon Bellows, MD Jonathon Bellows MD, MD 11/11/2021 8:04:55 AM This report has been signed electronically. Number of Addenda: 0 Note Initiated On: 11/11/2021 7:14 AM Scope Withdrawal Time: 0 hours 9 minutes 18 seconds  Total Procedure Duration: 0 hours 15  minutes 27 seconds  Estimated Blood Loss:  Estimated blood loss: none.      [facility]

## 2021-11-11 NOTE — H&P (Signed)
     Jonathon Bellows, MD [address] [place], [place], [place], [address] [address], [place], [place], [place], [address] Phone: [phone]  Fax: [phone]  Primary Care Physician:  Jearld Fenton, NP   Pre-Procedure History & Physical: HPI:  Michelle Branch is a 46 y.o. female is here for an colonoscopy.   History reviewed. No pertinent past medical history.  Past Surgical History:  Procedure Laterality Date   TUBAL LIGATION      Prior to Admission medications   Medication Sig Start Date End Date Taking? Authorizing Provider  Aspirin-Salicylamide-Caffeine (BC HEADACHE POWDER PO) Take by mouth daily.   Yes [provider]  methimazole (TAPAZOLE) 10 MG tablet Take 10 mg by mouth 3 (three) times daily. 08/08/21  Yes [provider]  Multiple Vitamins-Calcium (ONE-A-DAY WOMENS FORMULA PO) Take by mouth.   Yes [provider]  Simvastatin 40 MG/5ML SUSP Take 40 mg by mouth daily.   Yes [provider]    Allergies as of 09/17/2021   (No Known Allergies)    Family History  Problem Relation Age of Onset   Diabetes Paternal Grandmother     Social History   Socioeconomic History   Marital status: Married    Spouse name: Not on file   Number of children: 2   Years of education: associates   Highest education level: Associate degree: occupational, Hotel manager, or vocational program  Occupational History   Not on file  Tobacco Use   Smoking status: Never   Smokeless tobacco: Never  Vaping Use   Vaping Use: Never used  Substance and Sexual Activity   Alcohol use: Yes    Comment: ocassionally   Drug use: No   Sexual activity: Yes    Birth control/protection: Surgical    Comment: tibual  Other Topics Concern   Not on file  Social History Narrative   Not on file   Social Determinants of Health   Financial Resource Strain: Not on file  Food Insecurity: Not on file  Transportation Needs: Not on file  Physical Activity:  Not on file  Stress: Not on file  Social Connections: Not on file  Intimate Partner Violence: Not At Risk (04/10/2017)   Humiliation, Afraid, Rape, and Kick questionnaire    Fear of Current or Ex-Partner: No    Emotionally Abused: No    Physically Abused: No    Sexually Abused: No    Review of Systems: See HPI, otherwise negative ROS  Physical Exam: BP (!) 130/91   Pulse 72   Temp 98.5 F (36.9 C) (Temporal)   Resp 16   Ht 5\' 9"  (1.753 m)   Wt 117.9 kg   SpO2 100%   BMI 38.40 kg/m  General:   Alert,  pleasant and cooperative in NAD Head:  Normocephalic and atraumatic. Neck:  Supple; no masses or thyromegaly. Lungs:  Clear throughout to auscultation, normal respiratory effort.    Heart:  +S1, +S2, Regular rate and rhythm, No edema. Abdomen:  Soft, nontender and nondistended. Normal bowel sounds, without guarding, and without rebound.   Neurologic:  Alert and  oriented x4;  grossly normal neurologically.  Impression/Plan: Michelle Branch is here for an colonoscopy to be performed for Screening colonoscopy average risk   Risks, benefits, limitations, and alternatives regarding  colonoscopy have been reviewed with the patient.  Questions have been answered.  All parties agreeable.   Jonathon Bellows, MD  11/11/2021, 7:40 AM

## 2021-11-11 NOTE — Anesthesia Procedure Notes (Signed)
Date/Time: 11/11/2021 7:51 AM  Performed by: Lily Peer, Jamarr Treinen, CRNAPre-anesthesia Checklist: Patient identified, Emergency Drugs available, Suction available, Patient being monitored and Timeout performed Patient Re-evaluated:Patient Re-evaluated prior to induction Oxygen Delivery Method: Nasal cannula Induction Type: IV induction

## 2021-11-11 NOTE — Transfer of Care (Signed)
Immediate Anesthesia Transfer of Care Note  Patient: Delara Shepheard  Procedure(s) Performed: COLONOSCOPY WITH PROPOFOL  Patient Location: Endoscopy Unit  Anesthesia Type:General  Level of Consciousness: drowsy  Airway & Oxygen Therapy: Patient Spontanous Breathing  Post-op Assessment: Report given to RN and Post -op Vital signs reviewed and stable  Post vital signs: Reviewed and stable  Last Vitals:  Vitals Value Taken Time  BP 105/70 11/11/21 0806  Temp 36.3 C 11/11/21 0806  Pulse 92 11/11/21 0807  Resp 8 11/11/21 0807  SpO2 99 % 11/11/21 0807  Vitals shown include unvalidated device data.  Last Pain:  Vitals:   11/11/21 0806  TempSrc: Temporal  PainSc: Asleep         Complications: No notable events documented.

## 2021-11-12 ENCOUNTER — Encounter: Payer: Self-pay | Admitting: Gastroenterology

## 2021-11-14 NOTE — Anesthesia Postprocedure Evaluation (Signed)
Anesthesia Post Note  Patient: Quinita Kostelecky  Procedure(s) Performed: COLONOSCOPY WITH PROPOFOL  Patient location during evaluation: Endoscopy Anesthesia Type: General Level of consciousness: awake and alert Pain management: pain level controlled Vital Signs Assessment: post-procedure vital signs reviewed and stable Respiratory status: spontaneous breathing, nonlabored ventilation, respiratory function stable and patient connected to nasal cannula oxygen Cardiovascular status: blood pressure returned to baseline and stable Postop Assessment: no apparent nausea or vomiting Anesthetic complications: no   No notable events documented.   Last Vitals:  Vitals:   11/11/21 0816 11/11/21 0826  BP: (!) 106/93 114/74  Pulse: 85 64  Resp: 18 11  Temp:    SpO2: 100% 100%    Last Pain:  Vitals:   11/12/21 0758  TempSrc:   PainSc: 0-No pain                 Martha Clan

## 2022-01-31 ENCOUNTER — Ambulatory Visit
Admission: RE | Admit: 2022-01-31 | Discharge: 2022-01-31 | Disposition: A | Discharge status: Home / Self Care | Payer: BC Managed Care – PPO | Source: Ambulatory Visit | Attending: Internal Medicine | Admitting: Internal Medicine

## 2022-01-31 DIAGNOSIS — Z1231 Encounter for screening mammogram for malignant neoplasm of breast: Secondary | ICD-10-CM | POA: Insufficient documentation

## 2022-10-22 ENCOUNTER — Other Ambulatory Visit: Payer: Self-pay | Admitting: Internal Medicine

## 2022-10-23 ENCOUNTER — Other Ambulatory Visit (HOSPITAL_COMMUNITY): Payer: Self-pay

## 2022-10-23 MED ORDER — SIMVASTATIN 40 MG PO TABS
40.0000 mg | ORAL_TABLET | Freq: Every day | ORAL | 1 refills | Status: DC
  Filled 2022-10-23: qty 30, 30d supply, fill #0
  Filled 2022-10-23: qty 150, fill #0

## 2022-10-24 ENCOUNTER — Other Ambulatory Visit (HOSPITAL_COMMUNITY): Payer: Self-pay

## 2022-11-04 ENCOUNTER — Other Ambulatory Visit (HOSPITAL_COMMUNITY): Payer: Self-pay

## 2022-12-05 ENCOUNTER — Ambulatory Visit (INDEPENDENT_AMBULATORY_CARE_PROVIDER_SITE_OTHER): Payer: BC Managed Care – PPO | Admitting: Internal Medicine

## 2022-12-05 ENCOUNTER — Encounter: Payer: Self-pay | Admitting: Internal Medicine

## 2022-12-05 VITALS — BP 128/78 | HR 72 | Ht 69.0 in | Wt 254.0 lb

## 2022-12-05 DIAGNOSIS — E66812 Obesity, class 2: Secondary | ICD-10-CM | POA: Diagnosis not present

## 2022-12-05 DIAGNOSIS — Z1231 Encounter for screening mammogram for malignant neoplasm of breast: Secondary | ICD-10-CM

## 2022-12-05 DIAGNOSIS — E0590 Thyrotoxicosis, unspecified without thyrotoxic crisis or storm: Secondary | ICD-10-CM | POA: Diagnosis not present

## 2022-12-05 DIAGNOSIS — Z0001 Encounter for general adult medical examination with abnormal findings: Secondary | ICD-10-CM | POA: Diagnosis not present

## 2022-12-05 DIAGNOSIS — Z6837 Body mass index (BMI) 37.0-37.9, adult: Secondary | ICD-10-CM

## 2022-12-05 NOTE — Assessment & Plan Note (Signed)
Encouraged diet and exercise for weight loss ?

## 2022-12-05 NOTE — Patient Instructions (Signed)

## 2022-12-05 NOTE — Progress Notes (Signed)
Subjective:    Patient ID: Michelle Branch, female    DOB: 08/13/1975, 47 y.o.   MRN: [id number]  HPI  Patient presents to clinic today for her annual exam.  Flu: never Tetanus: 08/2021 COVID: X 2 Pap smear: 08/2021 Mammogram: 01/2022 Colon screening: 10/2021 Vision screening: annually Dentist: biannually  Diet: She does eat meat. She consumes fruits and veggies. She tries to avoid fried foods. She drinks mostly flavored water. Exercise: Walking  Review of Systems     No past medical history on file.  Current Outpatient Medications  Medication Sig Dispense Refill   Aspirin-Salicylamide-Caffeine (BC HEADACHE POWDER PO) Take by mouth daily.     methimazole (TAPAZOLE) 10 MG tablet Take 10 mg by mouth 3 (three) times daily.     Multiple Vitamins-Calcium (ONE-A-DAY WOMENS FORMULA PO) Take by mouth.     simvastatin (ZOCOR) 40 MG tablet Take 1 tablet (40 mg total) by mouth daily. 30 tablet 1   No current facility-administered medications for this visit.    No Known Allergies  Family History  Problem Relation Age of Onset   Diabetes Paternal Grandmother     Social History   Socioeconomic History   Marital status: Married    Spouse name: Not on file   Number of children: 2   Years of education: associates   Highest education level: Associate degree: occupational, Scientist, product/process development, or vocational program  Occupational History   Not on file  Tobacco Use   Smoking status: Never   Smokeless tobacco: Never  Vaping Use   Vaping status: Never Used  Substance and Sexual Activity   Alcohol use: Yes    Comment: ocassionally   Drug use: No   Sexual activity: Yes    Birth control/protection: Surgical    Comment: tibual  Other Topics Concern   Not on file  Social History Narrative   Not on file   Social Determinants of Health   Financial Resource Strain: Medium Risk (12/04/2022)   Overall Financial Resource Strain (CARDIA)    Difficulty of Paying Living Expenses: Somewhat  hard  Food Insecurity: No Food Insecurity (12/04/2022)   Hunger Vital Sign    Worried About Running Out of Food in the Last Year: Never true    Ran Out of Food in the Last Year: Never true  Transportation Needs: No Transportation Needs (12/04/2022)   PRAPARE - Administrator, Civil Service (Medical): No    Lack of Transportation (Non-Medical): No  Physical Activity: Insufficiently Active (12/04/2022)   Exercise Vital Sign    Days of Exercise per Week: 3 days    Minutes of Exercise per Session: 30 min  Stress: No Stress Concern Present (12/04/2022)   [employer] of Occupational Health - Occupational Stress Questionnaire    Feeling of Stress : Not at all  Social Connections: Moderately Integrated (12/04/2022)   Social Connection and Isolation Panel [NHANES]    Frequency of Communication with Friends and Family: More than three times a week    Frequency of Social Gatherings with Friends and Family: Once a week    Attends Religious Services: More than 4 times per year    Active Member of [employer] or Organizations: No    Attends Banker Meetings: Not on file    Marital Status: Married  Intimate Partner Violence: Not At Risk (04/10/2017)   Humiliation, Afraid, Rape, and Kick questionnaire    Fear of Current or Ex-Partner: No    Emotionally Abused: No  Physically Abused: No    Sexually Abused: No     Constitutional: Denies fever, malaise, fatigue, headache or abrupt weight changes.  HEENT: Denies eye pain, eye redness, ear pain, ringing in the ears, wax buildup, runny nose, nasal congestion, bloody nose, or sore throat. Respiratory: Denies difficulty breathing, shortness of breath, cough or sputum production.   Cardiovascular: Denies chest pain, chest tightness, palpitations or swelling in the hands or feet.  Gastrointestinal: Denies abdominal pain, bloating, constipation, diarrhea or blood in the stool.  GU: Patient reports urinary urgency.  Denies  frequency, pain with urination, burning sensation, blood in urine, odor or discharge. Musculoskeletal: Denies decrease in range of motion, difficulty with gait, muscle pain or joint pain and swelling.  Skin: Denies redness, rashes, lesions or ulcercations.  Neurological: Denies dizziness, difficulty with memory, difficulty with speech or problems with balance and coordination.  Psych: Denies anxiety, depression, SI/HI.  No other specific complaints in a complete review of systems (except as listed in HPI above).  Objective:   Physical Exam  BP 128/78   Pulse 72   Ht 5\' 9"  (1.753 m)   Wt 254 lb (115.2 kg)   SpO2 97%   BMI 37.51 kg/m   Wt Readings from Last 3 Encounters:  11/11/21 260 lb (117.9 kg)  09/16/21 268 lb (121.6 kg)  07/02/20 239 lb (108.4 kg)    General: Appears her stated age, obese, in NAD. Skin: Warm, dry and intact.  HEENT: Head: normal shape and size; Eyes: sclera white, no icterus, conjunctiva pink, PERRLA and EOMs intact;  Neck:  Neck supple, trachea midline. No masses, lumps or thyromegaly present.  Cardiovascular: Normal rate and rhythm. S1,S2 noted.  No murmur, rubs or gallops noted. No JVD or BLE edema.  Pulmonary/Chest: Normal effort and positive vesicular breath sounds. No respiratory distress. No wheezes, rales or ronchi noted.  Abdomen: Soft and nontender. Normal bowel sounds.  Musculoskeletal: Strength 5/5 BUE/BLE.  No difficulty with gait.  Neurological: Alert and oriented. Cranial nerves II-XII grossly intact. Coordination normal.  Psychiatric: Mood and affect normal. Behavior is normal. Judgment and thought content normal.     BMET    Component Value Date/Time   NA 138 09/16/2021 0840   K 4.3 09/16/2021 0840   CL 105 09/16/2021 0840   CO2 26 09/16/2021 0840   GLUCOSE 80 09/16/2021 0840   BUN 13 09/16/2021 0840   CREATININE 0.73 09/16/2021 0840   CALCIUM 8.7 09/16/2021 0840   GFRNONAA 119 12/20/2019 0752   GFRAA 138 12/20/2019 0752     Lipid Panel     Component Value Date/Time   CHOL 301 (H) 09/16/2021 0840   TRIG 54 09/16/2021 0840   HDL 84 09/16/2021 0840   CHOLHDL 3.6 09/16/2021 0840   LDLCALC 202 (H) 09/16/2021 0840    CBC    Component Value Date/Time   WBC 3.3 (L) 09/16/2021 0840   RBC 4.06 09/16/2021 0840   HGB 11.9 09/16/2021 0840   HCT 36.5 09/16/2021 0840   PLT 301 09/16/2021 0840   MCV 89.9 09/16/2021 0840   MCH 29.3 09/16/2021 0840   MCHC 32.6 09/16/2021 0840   RDW 12.5 09/16/2021 0840   LYMPHSABS 1,587 12/20/2019 0752   EOSABS 152 12/20/2019 0752   BASOSABS 21 12/20/2019 0752    Hgb A1C Lab Results  Component Value Date   HGBA1C 5.2 09/16/2021          Assessment & Plan:   Preventative health maintenance:  Flu shot declined Tetanus  UTD Encouraged her to get her COVID booster Pap smear UTD Mammogram ordered-she will call to schedule Colon screening UTD Encouraged her to consume a balanced diet and exercise regimen Her to see an eye doctor and dentist annually We will check CBC, c-Met, TSH, Free T4, T3, lipid, A1c today  RTC in 6 months, follow-up chronic conditions Nicki Reaper, NP

## 2022-12-06 LAB — COMPLETE METABOLIC PANEL WITH GFR
AG Ratio: 1.3 (calc) (ref 1.0–2.5)
ALT: 8 U/L (ref 6–29)
AST: 11 U/L (ref 10–35)
Albumin: 4.1 g/dL (ref 3.6–5.1)
Alkaline phosphatase (APISO): 88 U/L (ref 31–125)
BUN/Creatinine Ratio: 29 (calc) — ABNORMAL HIGH (ref 6–22)
BUN: 14 mg/dL (ref 7–25)
CO2: 27 mmol/L (ref 20–32)
Calcium: 9.3 mg/dL (ref 8.6–10.2)
Chloride: 104 mmol/L (ref 98–110)
Creat: 0.49 mg/dL — ABNORMAL LOW (ref 0.50–0.99)
Globulin: 3.2 g/dL (ref 1.9–3.7)
Glucose, Bld: 84 mg/dL (ref 65–99)
Potassium: 4.7 mmol/L (ref 3.5–5.3)
Sodium: 136 mmol/L (ref 135–146)
Total Bilirubin: 0.3 mg/dL (ref 0.2–1.2)
Total Protein: 7.3 g/dL (ref 6.1–8.1)
eGFR: 118 mL/min/{1.73_m2} (ref >=60)

## 2022-12-06 LAB — CBC
HCT: 38.3 % (ref 35.0–45.0)
Hemoglobin: 12.3 g/dL (ref 11.7–15.5)
MCH: 27.5 pg (ref 27.0–33.0)
MCHC: 32.1 g/dL (ref 32.0–36.0)
MCV: 85.5 fL (ref 80.0–100.0)
MPV: 10.2 fL (ref 7.5–12.5)
Platelets: 310 10*3/uL (ref 140–400)
RBC: 4.48 10*6/uL (ref 3.80–5.10)
RDW: 12.5 % (ref 11.0–15.0)
WBC: 3.6 10*3/uL — ABNORMAL LOW (ref 3.8–10.8)

## 2022-12-06 LAB — LIPID PANEL
Cholesterol: 281 mg/dL — ABNORMAL HIGH (ref <200)
HDL: 68 mg/dL (ref >=50)
LDL Cholesterol (Calc): 200 mg/dL — ABNORMAL HIGH
Non-HDL Cholesterol (Calc): 213 mg/dL — ABNORMAL HIGH (ref <130)
Total CHOL/HDL Ratio: 4.1 (calc) (ref <5.0)
Triglycerides: 41 mg/dL (ref <150)

## 2022-12-06 LAB — T3: T3, Total: 136 ng/dL (ref 76–181)

## 2022-12-06 LAB — HEMOGLOBIN A1C
Hgb A1c MFr Bld: 5.3 %{Hb} (ref <5.7)
Mean Plasma Glucose: 105 mg/dL
eAG (mmol/L): 5.8 mmol/L

## 2022-12-06 LAB — TSH: TSH: 0.01 m[IU]/L — ABNORMAL LOW

## 2022-12-06 LAB — T4, FREE: Free T4: 1.8 ng/dL (ref 0.8–1.8)

## 2022-12-12 ENCOUNTER — Telehealth: Payer: Self-pay

## 2022-12-12 MED ORDER — ATORVASTATIN CALCIUM 40 MG PO TABS: 40.0000 mg | ORAL_TABLET | Freq: Every day | ORAL | 1 refills | Status: AC

## 2022-12-12 MED ORDER — METHIMAZOLE 10 MG PO TABS: 10.0000 mg | ORAL_TABLET | Freq: Three times a day (TID) | ORAL | 1 refills | Status: AC

## 2022-12-12 NOTE — Telephone Encounter (Signed)
Copied from CRM [id number]. Topic: General - Other >> Dec 11, 2022  3:27 PM Ja-Kwan M wrote: Reason for CRM: Pt stated that she would like to be prescribed the two medications that were discussed. Pt requests that the prescriptions be sent to [facility] [address] ([address]), [place] - [address] Phone: [phone]  Fax: [phone]

## 2022-12-12 NOTE — Telephone Encounter (Signed)
Left message for patient to call and schedule appointment.

## 2022-12-12 NOTE — Telephone Encounter (Signed)
Medications sent to pharmacy. I would like to see her back in 3 months for recheck instead of 6 months.

## 2022-12-12 NOTE — Addendum Note (Signed)
Addended by: Lorre Munroe on: 12/12/2022 10:35 AM   Modules accepted: Orders

## 2023-02-16 ENCOUNTER — Ambulatory Visit
Admission: RE | Admit: 2023-02-16 | Discharge: 2023-02-16 | Disposition: A | Discharge status: Home / Self Care | Payer: BC Managed Care – PPO | Source: Ambulatory Visit | Attending: Internal Medicine | Admitting: Internal Medicine

## 2023-02-16 DIAGNOSIS — Z1231 Encounter for screening mammogram for malignant neoplasm of breast: Secondary | ICD-10-CM | POA: Insufficient documentation

## 2023-02-20 ENCOUNTER — Ambulatory Visit: Payer: BC Managed Care – PPO | Admitting: Internal Medicine

## 2023-03-17 ENCOUNTER — Ambulatory Visit: Payer: 59 | Admitting: Family Medicine

## 2023-03-17 ENCOUNTER — Encounter: Payer: Self-pay | Admitting: Family Medicine

## 2023-03-17 VITALS — BP 110/78 | HR 88 | Ht 69.0 in | Wt 258.0 lb

## 2023-03-17 DIAGNOSIS — J9801 Acute bronchospasm: Secondary | ICD-10-CM | POA: Diagnosis not present

## 2023-03-17 DIAGNOSIS — J069 Acute upper respiratory infection, unspecified: Secondary | ICD-10-CM | POA: Diagnosis not present

## 2023-03-17 LAB — POC COVID19/FLU A&B COMBO
Covid Antigen, POC: NEGATIVE
Influenza A Antigen, POC: NEGATIVE
Influenza B Antigen, POC: NEGATIVE

## 2023-03-17 MED ORDER — ALBUTEROL SULFATE HFA 108 (90 BASE) MCG/ACT IN AERS: 2.0000 | INHALATION_SPRAY | RESPIRATORY_TRACT | 0 refills | Status: AC | PRN

## 2023-03-17 MED ORDER — IPRATROPIUM BROMIDE 0.06 % NA SOLN: 2.0000 | Freq: Four times a day (QID) | NASAL | 0 refills | Status: AC

## 2023-03-17 NOTE — Progress Notes (Signed)
Subjective:    Patient ID: Michelle Branch, female    DOB: 12-Dec-1975, 48 y.o.   MRN: [id number]  Michelle Branch is a 48 y.o. female presenting on 03/17/2023 for URI  Patient presents for a same day appointment.  PCP Nicki Reaper, FNP    HPI  Discussed the use of AI scribe software for clinical note transcription with the patient, who gave verbal consent to proceed.  History of Present Illness    The patient presents with symptoms of a viral respiratory illness.  Symptoms began last Thursday following exposure to a student with walking pneumonia. Initially, she experienced a cough, which was followed by the development of a fever.  Her temperature peaked at 102.80F, accompanied by wheezing and chest pain during coughing. By midnight, her temperature decreased to 101.30F, but she lacked additional fever reducers at home. The following morning, her temperature was 101F, and she developed a headache, although the wheezing had subsided. She has no history of asthma or chronic respiratory conditions and denies any usual breathing issues.  She has been taking Mucinex DM for cough and congestion every twelve hours. For hydration, she consumes flavored water and juice. No other medications for fever or pain relief have been used.  Today she feels similar, with mild fever still 100+. Did not take any anti fever meds yet. Still having cough worse in AM and PM evening.        12/05/2022    9:21 AM 09/16/2021    8:36 AM 07/02/2020    3:19 PM  Depression screen PHQ 2/9  Decreased Interest 2 0 0  Down, Depressed, Hopeless 0 0 0  PHQ - 2 Score 2 0 0  Altered sleeping 0  0  Tired, decreased energy 2  0  Change in appetite 0  3  Feeling bad or failure about yourself  0  0  Trouble concentrating 0  0  Moving slowly or fidgety/restless 0  0  Suicidal thoughts   0  PHQ-9 Score 4  3  Difficult doing work/chores   Not difficult at all       12/05/2022    9:21 AM 07/02/2020    3:20  PM 06/05/2017    8:43 AM  GAD 7 : Generalized Anxiety Score  Nervous, Anxious, on Edge 0 0 0  Control/stop worrying 0 0 0  Worry too much - different things 0 0 0  Trouble relaxing 0 0 0  Restless 0 0 0  Easily annoyed or irritable 0 0 0  Afraid - awful might happen 0 0 0  Total GAD 7 Score 0 0 0  Anxiety Difficulty  Not difficult at all Not difficult at all    Social History   Tobacco Use   Smoking status: Never   Smokeless tobacco: Never  Vaping Use   Vaping status: Never Used  Substance Use Topics   Alcohol use: Yes    Comment: ocassionally   Drug use: No    Review of Systems Per HPI unless specifically indicated above     Objective:    BP 110/78   Pulse 88   Ht 5\' 9"  (1.753 m)   Wt 258 lb (117 kg)   LMP 01/30/2023   SpO2 98%   BMI 38.10 kg/m   Wt Readings from Last 3 Encounters:  03/17/23 258 lb (117 kg)  12/05/22 254 lb (115.2 kg)  11/11/21 260 lb (117.9 kg)    Physical Exam Vitals and nursing note reviewed.  Constitutional:  General: She is not in acute distress.    Appearance: She is well-developed. She is not diaphoretic.     Comments: Well-appearing, comfortable, cooperative  HENT:     Head: Normocephalic and atraumatic.     Right Ear: Tympanic membrane, ear canal and external ear normal. There is no impacted cerumen.     Left Ear: Tympanic membrane, ear canal and external ear normal. There is no impacted cerumen.  Eyes:     General:        Right eye: No discharge.        Left eye: No discharge.     Conjunctiva/sclera: Conjunctivae normal.  Neck:     Thyroid: No thyromegaly.  Cardiovascular:     Rate and Rhythm: Normal rate and regular rhythm.     Heart sounds: Normal heart sounds. No murmur heard. Pulmonary:     Effort: Pulmonary effort is normal. No respiratory distress.     Breath sounds: No wheezing or rales.     Comments: Mild tight breathing with cough. But no focal wheezing. Musculoskeletal:        General: Normal range of  motion.     Cervical back: Normal range of motion and neck supple.  Lymphadenopathy:     Cervical: No cervical adenopathy.  Skin:    General: Skin is warm and dry.     Findings: No erythema or rash.  Neurological:     Mental Status: She is alert and oriented to person, place, and time.  Psychiatric:        Behavior: Behavior normal.     Comments: Well groomed, good eye contact, normal speech and thoughts     Results for orders placed or performed in visit on 03/17/23  POC Covid19/Flu A&B Antigen   Collection Time: 03/17/23 12:55 PM  Result Value Ref Range   Influenza A Antigen, POC Negative Negative   Influenza B Antigen, POC Negative Negative   Covid Antigen, POC Negative Negative      Assessment & Plan:   Problem List Items Addressed This Visit   None Visit Diagnoses       Viral URI with cough    -  Primary   Relevant Medications   ipratropium (ATROVENT) 0.06 % nasal spray   albuterol (VENTOLIN HFA) 108 (90 Base) MCG/ACT inhaler   Other Relevant Orders   POC Covid19/Flu A&B Antigen (Completed)     Bronchospasm, acute       Relevant Medications   albuterol (VENTOLIN HFA) 108 (90 Base) MCG/ACT inhaler        Viral Respiratory Illness Acute onset of cough, fever, and chest pain with exposure to a student with pneumonia.  No history of asthma or other respiratory conditions. Today rapid POC Negative for flu and COVID-19.   -Continue OTC Mucinex DM every 12 hours for cough and congestion. Start Atrovent nasal spray decongestant 2 sprays in each nostril up to 4 times daily for 7 days -Take Tylenol Extra Strength 500mg  three times a day for fever and aches. -Consider ibuprofen for additional pain relief if needed. -Use new Rx albuterol rescue inhaler as needed for wheezing. -Stay hydrated with water, low calorie Gatorade, or apple juice mixed with water. -Consider antibiotic if symptoms do not improve; patient to call or message office. -Return to work when  fever-free for 24 hours without medication; provided two work notes for potential return dates.         Orders Placed This Encounter  Procedures   POC Covid19/Flu A&B  Antigen    Meds ordered this encounter  Medications   ipratropium (ATROVENT) 0.06 % nasal spray    Sig: Place 2 sprays into both nostrils 4 (four) times daily. For up to 5-7 days then stop.    Dispense:  15 mL    Refill:  0   albuterol (VENTOLIN HFA) 108 (90 Base) MCG/ACT inhaler    Sig: Inhale 2 puffs into the lungs every 4 (four) hours as needed for wheezing or shortness of breath.    Dispense:  8 g    Refill:  0    Follow up plan: Return if symptoms worsen or fail to improve.   Saralyn Pilar, DO [facility] 03/17/2023, 11:25 AM

## 2023-03-17 NOTE — Patient Instructions (Addendum)
Thank you for coming to the office today.  1. It sounds like you have a Upper Respiratory Virus - this will most likely run it's course in 7 to 10 days. Recommend good hand washing.  - Drink plenty of fluids to improve congestion - You may try over the counter Nasal Saline spray (Simply Saline, Ocean Spray) as needed to reduce congestion. - Drink warm herbal tea with honey for sore throat - Start taking Tylenol extra strength 1 to 2 tablets every 6-8 hours for aches or fever/chills for next few days as needed. Alternate with Ibuprofen 200mg  x 2 or 3 = 400-600mg  per dose with meal every 6-8 hours or 3 times per day, alternate with Tylenol  If symptoms significantly worsening with persistent fevers/chills despite tylenol/ibpurofen, nausea, vomiting unable to tolerate food/fluids or medicine, body aches, or shortness of breath, sinus pain pressure or worsening productive cough, then follow-up for re-evaluation, may seek more immediate care at Urgent Care or ED if more concerned for emergency.  We can consider Antibiotic if not improved by end of the week, call or message.   Please schedule a Follow-up Appointment to: Return if symptoms worsen or fail to improve.  If you have any other questions or concerns, please feel free to call the office or send a message through MyChart. You may also schedule an earlier appointment if necessary.  Additionally, you may be receiving a survey about your experience at our office within a few days to 1 week by e-mail or mail. We value your feedback.  Saralyn Pilar, DO [facility], [place]

## 2023-05-22 ENCOUNTER — Ambulatory Visit: Payer: Self-pay | Admitting: Internal Medicine

## 2024-03-25 ENCOUNTER — Ambulatory Visit: Payer: Self-pay | Admitting: Nurse Practitioner

## 2024-08-01 ENCOUNTER — Ambulatory Visit: Payer: Self-pay | Admitting: Nurse Practitioner
